# Patient Record
Sex: Female | Born: 1970 | Race: Black or African American | Hispanic: No | Smoking: Current some day smoker
Health system: Southern US, Community
[De-identification: ages and names within clinical notes are randomized; demographics above are authoritative.]

## PROBLEM LIST (undated history)

## (undated) DIAGNOSIS — D649 Anemia, unspecified: Secondary | ICD-10-CM

## (undated) DIAGNOSIS — R011 Cardiac murmur, unspecified: Secondary | ICD-10-CM

## (undated) HISTORY — DX: Cardiac murmur, unspecified: R01.1

## (undated) HISTORY — PX: CHOLECYSTECTOMY: SHX55

## (undated) HISTORY — DX: Anemia, unspecified: D64.9

---

## 2000-06-22 ENCOUNTER — Other Ambulatory Visit: Admission: RE | Admit: 2000-06-22 | Discharge: 2000-06-22 | Payer: Self-pay | Admitting: Family Medicine

## 2001-12-24 ENCOUNTER — Emergency Department (HOSPITAL_COMMUNITY): Admission: EM | Admit: 2001-12-24 | Discharge: 2001-12-24 | Payer: Self-pay | Admitting: Emergency Medicine

## 2002-12-04 ENCOUNTER — Inpatient Hospital Stay (HOSPITAL_COMMUNITY): Admission: AD | Admit: 2002-12-04 | Discharge: 2002-12-04 | Payer: Self-pay | Admitting: Family Medicine

## 2002-12-04 ENCOUNTER — Encounter: Payer: Self-pay | Admitting: Family Medicine

## 2002-12-25 ENCOUNTER — Inpatient Hospital Stay (HOSPITAL_COMMUNITY): Admission: AD | Admit: 2002-12-25 | Discharge: 2002-12-25 | Payer: Self-pay | Admitting: Obstetrics & Gynecology

## 2003-04-02 ENCOUNTER — Inpatient Hospital Stay (HOSPITAL_COMMUNITY): Admission: AD | Admit: 2003-04-02 | Discharge: 2003-04-02 | Payer: Self-pay | Admitting: Obstetrics

## 2003-04-03 ENCOUNTER — Encounter (INDEPENDENT_AMBULATORY_CARE_PROVIDER_SITE_OTHER): Payer: Self-pay

## 2003-04-03 ENCOUNTER — Inpatient Hospital Stay (HOSPITAL_COMMUNITY): Admission: AD | Admit: 2003-04-03 | Discharge: 2003-04-06 | Payer: Self-pay | Admitting: Obstetrics

## 2003-04-08 ENCOUNTER — Encounter: Admission: RE | Admit: 2003-04-08 | Discharge: 2003-05-08 | Payer: Self-pay | Admitting: Obstetrics

## 2006-04-03 ENCOUNTER — Emergency Department (HOSPITAL_COMMUNITY): Admission: EM | Admit: 2006-04-03 | Discharge: 2006-04-03 | Payer: Self-pay | Admitting: Emergency Medicine

## 2007-02-24 ENCOUNTER — Ambulatory Visit (HOSPITAL_COMMUNITY): Admission: RE | Admit: 2007-02-24 | Discharge: 2007-02-24 | Payer: Self-pay | Admitting: Obstetrics & Gynecology

## 2007-05-15 ENCOUNTER — Inpatient Hospital Stay (HOSPITAL_COMMUNITY): Admission: AD | Admit: 2007-05-15 | Discharge: 2007-05-15 | Payer: Self-pay | Admitting: Obstetrics

## 2007-05-16 ENCOUNTER — Inpatient Hospital Stay (HOSPITAL_COMMUNITY): Admission: AD | Admit: 2007-05-16 | Discharge: 2007-05-16 | Payer: Self-pay | Admitting: Obstetrics

## 2007-05-16 ENCOUNTER — Inpatient Hospital Stay (HOSPITAL_COMMUNITY): Admission: AD | Admit: 2007-05-16 | Discharge: 2007-05-19 | Payer: Self-pay | Admitting: Obstetrics

## 2009-12-10 ENCOUNTER — Ambulatory Visit: Payer: Self-pay | Admitting: Internal Medicine

## 2009-12-21 ENCOUNTER — Emergency Department (HOSPITAL_COMMUNITY): Admission: EM | Admit: 2009-12-21 | Discharge: 2009-12-21 | Payer: Self-pay | Admitting: Emergency Medicine

## 2010-03-21 ENCOUNTER — Ambulatory Visit: Payer: Self-pay | Admitting: Internal Medicine

## 2010-06-12 ENCOUNTER — Ambulatory Visit: Payer: Self-pay | Admitting: Internal Medicine

## 2010-06-20 ENCOUNTER — Ambulatory Visit (HOSPITAL_COMMUNITY): Admission: RE | Admit: 2010-06-20 | Discharge: 2010-06-20 | Payer: Self-pay | Admitting: Internal Medicine

## 2011-03-19 ENCOUNTER — Ambulatory Visit: Payer: Medicaid Other | Attending: Family Medicine | Admitting: Rehabilitative and Restorative Service Providers"

## 2011-03-19 DIAGNOSIS — M25519 Pain in unspecified shoulder: Secondary | ICD-10-CM | POA: Insufficient documentation

## 2011-03-19 DIAGNOSIS — IMO0001 Reserved for inherently not codable concepts without codable children: Secondary | ICD-10-CM | POA: Insufficient documentation

## 2011-03-19 DIAGNOSIS — M542 Cervicalgia: Secondary | ICD-10-CM | POA: Insufficient documentation

## 2011-03-24 LAB — URINALYSIS, ROUTINE W REFLEX MICROSCOPIC
Bilirubin Urine: NEGATIVE
Glucose, UA: NEGATIVE mg/dL
Ketones, ur: NEGATIVE mg/dL
Protein, ur: NEGATIVE mg/dL
pH: 6.5 (ref 5.0–8.0)

## 2011-03-24 LAB — URINE MICROSCOPIC-ADD ON

## 2011-03-24 LAB — PREGNANCY, URINE: Preg Test, Ur: NEGATIVE

## 2011-03-25 ENCOUNTER — Ambulatory Visit: Payer: Medicaid Other | Attending: Family Medicine | Admitting: Physical Therapy

## 2011-03-25 DIAGNOSIS — IMO0001 Reserved for inherently not codable concepts without codable children: Secondary | ICD-10-CM | POA: Insufficient documentation

## 2011-03-25 DIAGNOSIS — M25519 Pain in unspecified shoulder: Secondary | ICD-10-CM | POA: Insufficient documentation

## 2011-03-25 DIAGNOSIS — M542 Cervicalgia: Secondary | ICD-10-CM | POA: Insufficient documentation

## 2011-04-02 ENCOUNTER — Ambulatory Visit: Payer: Medicaid Other | Admitting: Rehabilitative and Restorative Service Providers"

## 2011-04-03 ENCOUNTER — Encounter: Payer: Medicaid Other | Admitting: Physical Therapy

## 2011-04-08 ENCOUNTER — Ambulatory Visit: Payer: Medicaid Other | Admitting: Physical Therapy

## 2011-05-09 NOTE — Op Note (Signed)
[name], [name]                         ACCOUNT NO.:  [id number]   MEDICAL RECORD NO.:  [id number]                   PATIENT TYPE:  INP   LOCATION:  9110                                 FACILITY:  [facility]   PHYSICIAN:  Roseanna Rainbow, M.D.         DATE OF BIRTH:  [birth year]   DATE OF PROCEDURE:  04/03/2003  DATE OF DISCHARGE:                                 OPERATIVE REPORT   PREOPERATIVE DIAGNOSES:  1. Prolonged fetal heart rate deceleration.  2. Meconium stained fluid.  3. Intrauterine pregnancy at term.  4. Active labor.   POSTOPERATIVE DIAGNOSES:  1. Prolonged fetal heart rate deceleration.  2. Meconium stained fluid.  3. Intrauterine pregnancy at term.  4. Active labor.   PROCEDURE:  Primary low transverse cesarean section via Pfannenstiel  incision.   SURGEON:  Roseanna Rainbow, M.D.   ANESTHESIA:  Spinal.   COMPLICATIONS:  None.   ESTIMATED BLOOD LOSS:  800 mL.   FLUIDS:  As per anesthesiology.   URINE OUTPUT:  As per anesthesiology.   INDICATIONS:  The patient is a 40 year old at 40+ weeks.  She underwent a  prolonged deceleration with oxytocin.  Meconium stained fluid noted.   FINDINGS:  Female in cephalic presentation, occipitoposterior.  Thick  meconium below the cords. Neonatology present at delivery.  Apgars 7 and 8.  Umbilical artery pH 7.21.  Normal uterus, tubes and ovaries.   DESCRIPTION OF PROCEDURE:  The patient was taken to the operating room where  a spinal anesthetic was administered and found to be adequate.  She was then  prepped and draped in the usual sterile fashion in the dorsal supine  position with a leftward tilt.  A Pfannenstiel skin incision was then made  with the scalpel and carried to and through the underlying layer of the  fascia with the Bovie.  The fascia was nicked in the midline, and the  incision extended laterally with Mayo scissors.  The superior aspect of the  fascial incision was then grasped with  Kocher clamps, elevated and the  underlying rectus muscles dissected off.  Attention was then turned to the  inferior aspect of this incision which was manipulated in a similar fashion.  The rectus muscles were then separated in the midline and the parietal  peritoneum identified, tented up and entered sharply with Metzenbaum  scissors.  The peritoneal incision was then superiorly and inferiorly with  good visualization of the bladder.  The bladder blade was then inserted and  the vesicouterine peritoneum identified, grasped with pick ups and entered  sharply with the Metzenbaum scissors.  This incision was then extended  laterally and the bladder flap created digitally.  The bladder blade was  then reinserted.  The lower uterine segment was incised in a transverse  fashion with the scalpel.  The uterine incision was then extended bluntly.  The bladder blade was removed and the head was delivered atraumatically.  The  nose and mouth were suctioned with a DeLee suction, and the cord clamped  and cut.  The infant was handed off to the awaiting neonatologist.  Cord  gases were sent.  The placenta was then removed.  The uterus was cleared of  all clots and debris.  The uterine incision was repaired with 0 Monocryl in  a running locked fashion.  A second layer of the same suture was used to  obtain excellent hemostasis.  The gutters were cleared of all clots.  The  peritoneum was closed with 2-0 Vicryl.  The fascia was reapproximated with 0  PDS in a running fashion.  The skin was closed with staples.  The patient  tolerated the procedure well.  Sponge, lap and needle counts were correct  x2.  Cefazolin 1 g was given at cord clamp.  The patient was taken to the  PACU in stable condition.                                               Roseanna Rainbow, M.D.    [name]  D:  04/03/2003  T:  04/03/2003  Job:  [id number]

## 2011-05-09 NOTE — Discharge Summary (Signed)
[name], [name]                         ACCOUNT NO.:  [id number]   MEDICAL RECORD NO.:  [id number]                   PATIENT TYPE:  INP   LOCATION:  9110                                 FACILITY:  [facility]   PHYSICIAN:  Roseanna Rainbow, M.D.         DATE OF BIRTH:  01/01/1971   DATE OF ADMISSION:  04/03/2003  DATE OF DISCHARGE:  04/06/2003                                 DISCHARGE SUMMARY   CHIEF COMPLAINT:  The patient is a 40 year old, para 0, African-American  female with an intrauterine pregnancy, estimated date of confinement March 31, 2003, who presents with regular uterine contractions.   HISTORY OF PRESENT ILLNESS:  Prenatal care significant for late entry and a  positive GBS culture.  She had a Bartholin's abscess that was I&D'd where a  catheter was placed.  She had asymptomatic bacteruria and anemia.   PAST MEDICAL HISTORY:  Recurrent urinary tract infections.   PAST SURGICAL HISTORY:  See above.   MEDICATIONS:  1. Prenatal vitamins.  2. Septra.  3. Darvocet.   ALLERGIES:  No known drug allergies.   SOCIAL HISTORY:  Single.  Positive tobacco use.  Positive for alcohol use  prior to the pregnancy.   PHYSICAL EXAMINATION:  VITAL SIGNS:  Stable.  Afebrile.  ABDOMEN:  Gravid.  Nontender.  PELVIC:  Cervix 3 to 4 cm, 90% effaced with the vertex at a -1 station.  Artificial rupture of membranes with meconium-stained fluid.  An  intrauterine pressure catheter and fetal scalp electrode were placed.   ASSESSMENT:  1. Intrauterine pregnancy at 40+ weeks.  2. Meconium-stained fluid.  3. Early labor.  4. Group B Streptococcus positive.   PLAN:  1. Admission.  2. Amnioinfusion.  3. Antibiotic prophylaxis for positive GBS.  4. Monitor progress closely.   HOSPITAL COURSE:  Subsequent to the admission, there was a prolonged fetal  heart rate deceleration, and she was brought to the operating room for an  urgent cesarean delivery; please see the dictated  operative summary for  further details.  The postoperative course was uneventful.  Her hemoglobin  on postoperative day number one was 8.4.  She was discharged to home on  postoperative day number three, tolerating a regular diet.  She received  Depo-Provera intramuscularly for contraception.   DISCHARGE DIAGNOSES:  1. Intrauterine pregnancy at term.  2. Early labor.  3. Meconium-stained fluid.  4. Suspicious fetal heart tracing with a prolonged fetal heart rate     deceleration.   PROCEDURE:  Cesarean delivery.   CONDITION:  Stable.   DIET:  Regular.   ACTIVITY:  As per the instruction booklet.   DISPOSITION:  The patient is to follow up in the office in one week for an  incision check.   DISCHARGE MEDICATIONS:  1. Percocet.  2. Ferrous sulfate.  3. Ibuprofen.  4. Prenatal vitamins.  5. Colace as needed.  Roseanna Rainbow, M.D.    [name]  D:  05/09/2003  T:  05/09/2003  Job:  [id number]

## 2011-08-22 ENCOUNTER — Emergency Department (HOSPITAL_COMMUNITY)
Admission: EM | Admit: 2011-08-22 | Discharge: 2011-08-22 | Disposition: A | Discharge status: Home / Self Care | Payer: Medicaid Other | Attending: Emergency Medicine | Admitting: Emergency Medicine

## 2011-08-22 DIAGNOSIS — N390 Urinary tract infection, site not specified: Secondary | ICD-10-CM | POA: Insufficient documentation

## 2011-08-22 DIAGNOSIS — R109 Unspecified abdominal pain: Secondary | ICD-10-CM | POA: Insufficient documentation

## 2011-08-22 DIAGNOSIS — R300 Dysuria: Secondary | ICD-10-CM | POA: Insufficient documentation

## 2011-08-22 DIAGNOSIS — R350 Frequency of micturition: Secondary | ICD-10-CM | POA: Insufficient documentation

## 2011-08-22 LAB — URINE MICROSCOPIC-ADD ON

## 2011-08-22 LAB — URINALYSIS, ROUTINE W REFLEX MICROSCOPIC
Nitrite: POSITIVE — AB
Protein, ur: NEGATIVE mg/dL
Specific Gravity, Urine: 1.02 (ref 1.005–1.030)
Urobilinogen, UA: 0.2 mg/dL (ref 0.0–1.0)

## 2012-01-30 ENCOUNTER — Other Ambulatory Visit: Payer: Self-pay | Admitting: Obstetrics

## 2012-01-30 DIAGNOSIS — Z1231 Encounter for screening mammogram for malignant neoplasm of breast: Secondary | ICD-10-CM

## 2012-02-20 ENCOUNTER — Ambulatory Visit (HOSPITAL_COMMUNITY)
Admission: RE | Admit: 2012-02-20 | Discharge: 2012-02-20 | Disposition: A | Discharge status: Home / Self Care | Payer: Medicaid Other | Source: Ambulatory Visit | Attending: Obstetrics | Admitting: Obstetrics

## 2012-02-20 DIAGNOSIS — Z1231 Encounter for screening mammogram for malignant neoplasm of breast: Secondary | ICD-10-CM | POA: Insufficient documentation

## 2012-05-02 ENCOUNTER — Encounter (HOSPITAL_COMMUNITY): Payer: Self-pay | Admitting: *Deleted

## 2012-05-02 ENCOUNTER — Emergency Department (HOSPITAL_COMMUNITY)
Admission: EM | Admit: 2012-05-02 | Discharge: 2012-05-02 | Disposition: A | Discharge status: Home / Self Care | Payer: Medicaid Other | Attending: Emergency Medicine | Admitting: Emergency Medicine

## 2012-05-02 ENCOUNTER — Emergency Department (HOSPITAL_COMMUNITY): Payer: Medicaid Other

## 2012-05-02 DIAGNOSIS — R10819 Abdominal tenderness, unspecified site: Secondary | ICD-10-CM | POA: Insufficient documentation

## 2012-05-02 DIAGNOSIS — K8020 Calculus of gallbladder without cholecystitis without obstruction: Secondary | ICD-10-CM | POA: Insufficient documentation

## 2012-05-02 DIAGNOSIS — R1011 Right upper quadrant pain: Secondary | ICD-10-CM | POA: Insufficient documentation

## 2012-05-02 DIAGNOSIS — K8050 Calculus of bile duct without cholangitis or cholecystitis without obstruction: Secondary | ICD-10-CM

## 2012-05-02 LAB — COMPREHENSIVE METABOLIC PANEL
Alkaline Phosphatase: 69 U/L (ref 39–117)
BUN: 9 mg/dL (ref 6–23)
Chloride: 104 mEq/L (ref 96–112)
Creatinine, Ser: 0.72 mg/dL (ref 0.50–1.10)
GFR calc Af Amer: >90 mL/min (ref >90)
Glucose, Bld: 93 mg/dL (ref 70–99)
Potassium: 3.8 mEq/L (ref 3.5–5.1)
Total Bilirubin: 0.3 mg/dL (ref 0.3–1.2)
Total Protein: 7.6 g/dL (ref 6.0–8.3)

## 2012-05-02 LAB — URINALYSIS, ROUTINE W REFLEX MICROSCOPIC
Bilirubin Urine: NEGATIVE
Glucose, UA: NEGATIVE mg/dL
Hgb urine dipstick: NEGATIVE
Ketones, ur: NEGATIVE mg/dL
Protein, ur: NEGATIVE mg/dL
pH: 7 (ref 5.0–8.0)

## 2012-05-02 LAB — DIFFERENTIAL
Basophils Relative: 1 % (ref 0–1)
Eosinophils Relative: 0 % (ref 0–5)
Monocytes Absolute: 0.5 10*3/uL (ref 0.1–1.0)
Neutro Abs: 3.4 10*3/uL (ref 1.7–7.7)
Neutrophils Relative %: 66 % (ref 43–77)

## 2012-05-02 LAB — LIPASE, BLOOD: Lipase: 12 U/L (ref 11–59)

## 2012-05-02 LAB — CBC
HCT: 34.6 % — ABNORMAL LOW (ref 36.0–46.0)
Hemoglobin: 11.3 g/dL — ABNORMAL LOW (ref 12.0–15.0)
MCH: 23.4 pg — ABNORMAL LOW (ref 26.0–34.0)
RBC: 4.83 MIL/uL (ref 3.87–5.11)

## 2012-05-02 MED ORDER — ONDANSETRON 4 MG PO TBDP: 4.0000 mg | ORAL_TABLET | Freq: Three times a day (TID) | ORAL | Status: AC | PRN

## 2012-05-02 MED ORDER — HYDROCODONE-ACETAMINOPHEN 5-325 MG PO TABS: 2.0000 | ORAL_TABLET | ORAL | Status: AC | PRN

## 2012-05-02 NOTE — ED Notes (Signed)
Pt discharged home. Had no further questions at the time. 

## 2012-05-02 NOTE — Discharge Instructions (Signed)
Labs today were normal. Your ultrasound showed the you have gallstones, but no signs of gallbladder infection. The gallstones likely caused your pain. It is important that you followup with a surgeon to discuss the need for cholecystectomy to remove the gallbladder. Please plan to followup with [facility]. If you begin to run a fever, have worsening pain, nausea/vomiting, or any other concerns about worsening condition, please return to the emergency department.  Biliary Colic  Biliary colic is a steady or irregular pain in the upper abdomen. It is usually under the right side of the rib cage. It happens when gallstones interfere with the normal flow of bile from the gallbladder. Bile is a liquid that helps to digest fats. Bile is made in the liver and stored in the gallbladder. When you eat a meal, bile passes from the gallbladder through the cystic duct and the common bile duct into the small intestine. There, it mixes with partially digested food. If a gallstone blocks either of these ducts, the normal flow of bile is blocked. The muscle cells in the bile duct contract forcefully to try to move the stone. This causes the pain of biliary colic.  SYMPTOMS   A person with biliary colic usually complains of pain in the upper abdomen. This pain can be:   In the center of the upper abdomen just below the breastbone.   In the upper-right part of the abdomen, near the gallbladder and liver.   Spread back toward the right shoulder blade.   Nausea and vomiting.   The pain usually occurs after eating.   Biliary colic is usually triggered by the digestive system's demand for bile. The demand for bile is high after fatty meals. Symptoms can also occur when a person who has been fasting suddenly eats a very large meal. Most episodes of biliary colic pass after 1 to 5 hours. After the most intense pain passes, your abdomen may continue to ache mildly for about 24 hours.  DIAGNOSIS  After you  describe your symptoms, your caregiver will perform a physical exam. He or she will pay attention to the upper right portion of your belly (abdomen). This is the area of your liver and gallbladder. An ultrasound will help your caregiver look for gallstones. Specialized scans of the gallbladder may also be done. Blood tests may be done, especially if you have fever or if your pain persists. PREVENTION  Biliary colic can be prevented by controlling the risk factors for gallstones. Some of these risk factors, such as heredity, increasing age, and pregnancy are a normal part of life. Obesity and a high-fat diet are risk factors you can change through a healthy lifestyle. Women going through menopause who take hormone replacement therapy (estrogen) are also more likely to develop biliary colic. TREATMENT   Pain medication may be prescribed.   You may be encouraged to eat a fat-free diet.   If the first episode of biliary colic is severe, or episodes of colic keep retuning, surgery to remove the gallbladder (cholecystectomy) is usually recommended. This procedure can be done through small incisions using an instrument called a laparoscope. The procedure often requires a brief stay in the hospital. Some people can leave the hospital the same day. It is the most widely used treatment in people troubled by painful gallstones. It is effective and safe, with no complications in more than 90% of cases.   If surgery cannot be done, medication that dissolves gallstones may be used. This medication is  expensive and can take months or years to work. Only small stones will dissolve.   Rarely, medication to dissolve gallstones is combined with a procedure called shock-wave lithotripsy. This procedure uses carefully aimed shock waves to break up gallstones. In many people treated with this procedure, gallstones form again within a few years.  PROGNOSIS  If gallstones block your cystic duct or common bile duct, you are  at risk for repeated episodes of biliary colic. There is also a 25% chance that you will develop a gallbladder infection(acute cholecystitis), or some other complication of gallstones within 10 to 20 years. If you have surgery, schedule it at a time that is convenient for you and at a time when you are not sick. HOME CARE INSTRUCTIONS   Drink plenty of clear fluids.   Avoid fatty, greasy or fried foods, or any foods that make your pain worse.   Take medications as directed.  SEEK MEDICAL CARE IF:   You develop a fever over 100.5 F (38.1 C).   Your pain gets worse over time.   You develop nausea that prevents you from eating and drinking.   You develop vomiting.  SEEK IMMEDIATE MEDICAL CARE IF:   You have continuous or severe belly (abdominal) pain which is not relieved with medications.   You develop nausea and vomiting which is not relieved with medications.   You have symptoms of biliary colic and you suddenly develop a fever and shaking chills. This may signal cholecystitis. Call your caregiver immediately.   You develop a yellow color to your skin or the white part of your eyes (jaundice).  Document Released: 05/11/2006 Document Revised: 11/27/2011 Document Reviewed: 07/20/2008 [facility] Patient Information 2012 [place], [place].

## 2012-05-02 NOTE — ED Provider Notes (Signed)
History     CSN: [id number]  Arrival date & time 05/02/12  0809   First MD Initiated Contact with Patient 05/02/12 [phone]      Chief Complaint  Patient presents with  . Abdominal Pain  . Back Pain    (Consider location/radiation/quality/duration/timing/severity/associated sxs/prior treatment) HPI History from patient. 41 year old female who presents with abdominal pain. States this began last night around 1 AM, and she was unable to sleep due to the discomfort. Pain is described as sharp and located to the right upper quadrant, with occasional radiation to the back. It worsens with lying flat, and does not change with movement. She denies any nausea, vomiting, diarrhea. She's not had any vaginal bleeding, discharge, or urinary symptoms. Has never had anything like this before. Surgical history significant for cesarean section, but otherwise no abdominal surgeries. No treatment at home prior to arrival.  She's currently using Depo-Provera for birth control. States that she does drink alcohol, but seldom does so.  History reviewed. No pertinent past medical history.  History reviewed. No pertinent past surgical history.  History reviewed. No pertinent family history.  History  Substance Use Topics  . Smoking status: Not on file  . Smokeless tobacco: Not on file  . Alcohol Use: Yes     occ    OB History    Grav Para Term Preterm Abortions TAB SAB Ect Mult Living                  Review of Systems  Constitutional: Negative for fever and chills.  HENT: Negative.   Respiratory: Negative for cough, chest tightness and shortness of breath.   Cardiovascular: Negative for chest pain.  Gastrointestinal: Positive for abdominal pain. Negative for nausea, vomiting and diarrhea.  Genitourinary: Negative for dysuria, flank pain, vaginal bleeding and vaginal discharge.  Musculoskeletal: Negative for myalgias.  Skin: Negative for color change and rash.    Allergies  Review of  patient's allergies indicates no known allergies.  Home Medications  No current outpatient prescriptions on file.  BP 155/101  Pulse 85  Temp(Src) 98.3 F (36.8 C) (Oral)  Resp 20  SpO2 100%  LMP 04/18/2012  Physical Exam  Nursing note and vitals reviewed. Constitutional: She appears well-developed and well-nourished. No distress.  HENT:  Head: Normocephalic and atraumatic.  Eyes: EOM are normal.  Neck: Normal range of motion.  Cardiovascular: Normal rate, regular rhythm and normal heart sounds.   Pulmonary/Chest: Effort normal and breath sounds normal. She exhibits no tenderness.  Abdominal: Soft. Bowel sounds are normal. There is tenderness.       Tenderness to palpation in right upper quadrant, with very slight tenderness in the epigastrium as well. Negative Murphy sign. No rebound or guarding.  Musculoskeletal: Normal range of motion.  Neurological: She is alert.  Skin: Skin is warm and dry. She is not diaphoretic.  Psychiatric: She has a normal mood and affect.    ED Course  Procedures (including critical care time)  Labs Reviewed  URINALYSIS, ROUTINE W REFLEX MICROSCOPIC - Abnormal; Notable for the following:    APPearance CLOUDY (*)    Leukocytes, UA TRACE (*)    All other components within normal limits  CBC - Abnormal; Notable for the following:    Hemoglobin 11.3 (*)    HCT 34.6 (*)    MCV 71.6 (*)    MCH 23.4 (*)    All other components within normal limits  URINE MICROSCOPIC-ADD ON - Abnormal; Notable for the following:  Squamous Epithelial / LPF FEW (*)    All other components within normal limits  POCT PREGNANCY, URINE  DIFFERENTIAL  COMPREHENSIVE METABOLIC PANEL  LIPASE, BLOOD   US Abdomen Complete  05/02/2012  *RADIOLOGY REPORT*  Clinical Data:  Right upper quadrant pain  COMPLETE ABDOMINAL ULTRASOUND  Comparison:  None.  Findings:  Gallbladder:  Mobile gallstones are noted within gallbladder the largest measures 7.7 mm.  There is mild thickening  of gallbladder wall up to 4 mm without sonographic Murphy's sign.  Common bile duct:  Measures 3.7 mm in diameter within normal limits.  Liver:  No focal lesion identified.  Within normal limits in parenchymal echogenicity.  IVC:  Appears normal.  Pancreas:  No focal abnormality seen.  Spleen:  Measures 3.8 cm in length.  Normal echogenicity.  Right Kidney:  Measures 11.7 cm in length.  No hydronephrosis, mass or diagnostic renal calculus.  Left Kidney:  Measures 12.3 cm in length.  No mass, hydronephrosis or diagnostic renal calculus the  Abdominal aorta:  No aneurysm identified. Measures up to 1.7 cm in diameter.  IMPRESSION:  1.  Mobile gallstones are noted within gallbladder the largest measures 7.7 mm. 2.  Mild thickening of gallbladder wall without sonographic Murphy's sign. 3.  Normal CBD. 4.  No hydronephrosis or diagnostic renal calculus.  Original Report Authenticated By: Natasha Mead, M.D.     1. Biliary colic   2. Gallstones       MDM  Patient presents with right upper quadrant/epigastric abdominal pain, which started last evening. She is afebrile and nontoxic appearing. On exam, she is noted to have tenderness mostly to the right upper quadrant, with a negative Murphy sign. Lab evaluation largely unremarkable. Ultrasound shows mobile gallstones with mild gallbladder wall thickening without Murphy sign. Discussed findings with Dr. Freida Busman. We will have the patient followup with surgery to discuss elective cholecystectomy. Findings discussed with patient. Return precautions discussed. She verbalized understanding and was agreeable with plan.        Grant Fontana, [place] 05/02/12 1145

## 2012-05-02 NOTE — ED Notes (Signed)
Pt reports unable to sleep last night due to lower back pain and right side abd pain. Denies n/v/d, vaginal or urinary symptoms. No acute distress noted at triage.

## 2012-05-02 NOTE — ED Notes (Signed)
Awaiting [place].  Pt resting quietly. Watching TV

## 2012-05-02 NOTE — ED Notes (Signed)
Pt in [place]

## 2012-05-03 MED ORDER — ACETAMINOPHEN 80 MG/0.8ML PO SUSP
ORAL | Status: AC
  Filled 2012-05-03: qty 15

## 2012-05-04 NOTE — ED Provider Notes (Signed)
Medical screening examination/treatment/procedure(s) were performed by non-physician practitioner and as supervising physician I was immediately available for consultation/collaboration.  Victorino Fatzinger T Teirra Carapia, MD 05/04/12 1511 

## 2012-05-26 ENCOUNTER — Encounter (INDEPENDENT_AMBULATORY_CARE_PROVIDER_SITE_OTHER): Payer: Self-pay | Admitting: Surgery

## 2012-05-26 ENCOUNTER — Ambulatory Visit (INDEPENDENT_AMBULATORY_CARE_PROVIDER_SITE_OTHER): Payer: Medicaid Other | Admitting: Surgery

## 2012-05-26 VITALS — BP 118/80 | HR 76 | Temp 97.9°F | Resp 14 | Ht 65.0 in | Wt 186.4 lb

## 2012-05-26 DIAGNOSIS — K8020 Calculus of gallbladder without cholecystitis without obstruction: Secondary | ICD-10-CM

## 2012-05-26 NOTE — Patient Instructions (Signed)
Laparoscopic Cholecystectomy Laparoscopic cholecystectomy is surgery to remove the gallbladder. The gallbladder is located slightly to the right of center in the abdomen, behind the liver. It is a concentrating and storage sac for the bile produced in the liver. Bile aids in the digestion and absorption of fats. Gallbladder disease (cholecystitis) is an inflammation of your gallbladder. This condition is usually caused by a buildup of gallstones (cholelithiasis) in your gallbladder. Gallstones can block the flow of bile, resulting in inflammation and pain. In severe cases, emergency surgery may be required. When emergency surgery is not required, you will have time to prepare for the procedure. Laparoscopic surgery is an alternative to open surgery. Laparoscopic surgery usually has a shorter recovery time. Your common bile duct may also need to be examined and explored. Your caregiver will discuss this with you if he or she feels this should be done. If stones are found in the common bile duct, they may be removed. LET YOUR CAREGIVER KNOW ABOUT:  Allergies to food or medicine.   Medicines taken, including vitamins, herbs, eyedrops, over-the-counter medicines, and creams.   Use of steroids (by mouth or creams).   Previous problems with anesthetics or numbing medicines.   History of bleeding problems or blood clots.   Previous surgery.   Other health problems, including diabetes and kidney problems.   Possibility of pregnancy, if this applies.  RISKS AND COMPLICATIONS All surgery is associated with risks. Some problems that may occur following this procedure include:  Infection.   Damage to the common bile duct, nerves, arteries, veins, or other internal organs such as the stomach or intestines.   Bleeding.   A stone may remain in the common bile duct.  BEFORE THE PROCEDURE  Do not take aspirin for 3 days prior to surgery or blood thinners for 1 week prior to surgery.   Do not eat or  drink anything after midnight the night before surgery.   Let your caregiver know if you develop a cold or other infectious problem prior to surgery.   You should be present 60 minutes before the procedure or as directed.  PROCEDURE  You will be given medicine that makes you sleep (general anesthetic). When you are asleep, your surgeon will make several small cuts (incisions) in your abdomen. One of these incisions is used to insert a small, lighted scope (laparoscope) into the abdomen. The laparoscope helps the surgeon see into your abdomen. Carbon dioxide gas will be pumped into your abdomen. The gas allows more room for the surgeon to perform your surgery. Other operating instruments are inserted through the other incisions. Laparoscopic procedures may not be appropriate when:  There is major scarring from previous surgery.   The gallbladder is extremely inflamed.   There are bleeding disorders or unexpected cirrhosis of the liver.   A pregnancy is near term.   Other conditions make the laparoscopic procedure impossible.  If your surgeon feels it is not safe to continue with a laparoscopic procedure, he or she will perform an open abdominal procedure. In this case, the surgeon will make an incision to open the abdomen. This gives the surgeon a larger view and field to work within. This may allow the surgeon to perform procedures that sometimes cannot be performed with a laparoscope alone. Open surgery has a longer recovery time. AFTER THE PROCEDURE  You will be taken to the recovery area where a nurse will watch and check your progress.   You may be allowed to go home   the same day.   Do not resume physical activities until directed by your caregiver.   You may resume a normal diet and activities as directed.  Document Released: 12/08/2005 Document Revised: 11/27/2011 Document Reviewed: 05/23/2011 [employer] Patient Information 2012 [employer].     Cholelithiasis Cholelithiasis (also called gallstones) is a form of gallbladder disease where gallstones form in your gallbladder. The gallbladder is a non-essential organ that stores bile made in the liver, which helps digest fats. Gallstones begin as small crystals and slowly grow into stones. Gallstone pain occurs when the gallbladder spasms, and a gallstone is blocking the duct. Pain can also occur when a stone passes out of the duct.  Women are more likely to develop gallstones than men. Other factors that increase the risk of gallbladder disease are:  Having multiple pregnancies. Physicians sometimes advise removing diseased gallbladders before future pregnancies.   Obesity.   Diets heavy in fried foods and fat.   Increasing age (older than 60).   Prolonged use of medications containing female hormones.   Diabetes mellitus.   Rapid weight loss.   Family history of gallstones (heredity).  SYMPTOMS  Feeling sick to your stomach (nauseous).   Abdominal pain.   Yellowing of the skin (jaundice).   Sudden pain. It may persist from several minutes to several hours.   Worsening pain with deep breathing or when jarred.   Fever.   Tenderness to the touch.  In some cases, when gallstones do not move into the bile duct, people have no pain or symptoms. These are called "silent" gallstones. TREATMENT In severe cases, emergency surgery may be required. HOME CARE INSTRUCTIONS   Only take over-the-counter or prescription medicines for pain, discomfort, or fever as directed by your caregiver.   Follow a low-fat diet until seen again. Fat causes the gallbladder to contract, which can result in pain.   Follow up as instructed. Attacks are almost always recurrent and surgery is usually required for permanent treatment.  SEEK IMMEDIATE MEDICAL CARE IF:   Your pain increases and is not controlled by medications.   You have an oral temperature above 102 F (38.9 C), not  controlled by medication.   You develop nausea and vomiting.  MAKE SURE YOU:   Understand these instructions.   Will watch your condition.   Will get help right away if you are not doing well or get worse.  Document Released: 12/04/2005 Document Revised: 11/27/2011 Document Reviewed: 02/06/2011 [employer] Patient Information 2012 [employer].Laparoscopic Cholecystectomy Care After Refer to this sheet in the next few weeks. These instructions provide you with information on caring for yourself after your procedure. Your caregiver may also give you more specific instructions. Your treatment has been planned according to current medical practices, but problems sometimes occur. Call your caregiver if you have any problems or questions after your procedure. HOME CARE INSTRUCTIONS   Change bandages (dressings) as directed by your caregiver.   Keep the wound dry and clean. The wound may be washed gently with soap and water. Gently blot or dab the area dry.   Do not take baths or use swimming pools or hot tubs for 10 days, or as instructed by your caregiver.   Only take over-the-counter or prescription medicines for pain, discomfort, or fever as directed by your caregiver.   Continue your normal diet as directed by your caregiver.   Do not lift anything heavier than 25 pounds (11.5 kg), or as directed by your caregiver.   Do not   play contact sports for 1 week, or as directed by your caregiver.  SEEK MEDICAL CARE IF:   There is redness, swelling, or increasing pain in the wound.   You notice yellowish-white fluid (pus) coming from the wound.   There is drainage from the wound that lasts longer than 1 day.   There is a bad smell coming from the wound or dressing.   The surgical cut (incision) breaks open.  SEEK IMMEDIATE MEDICAL CARE IF:   You develop a rash.   You have difficulty breathing.   You develop chest pain.   You develop any reaction or side effects to medicines  given.   You have a fever.   You have increasing pain in the shoulders (shoulder strap areas).   You have dizzy episodes or faint while standing.   You develop severe abdominal pain.   You feel sick to your stomach (nauseous) or throw up (vomit) and this lasts for more than 1 day.  MAKE SURE YOU:   Understand these instructions.   Will watch your condition.   Will get help right away if you are not doing well or get worse.  Document Released: 12/08/2005 Document Revised: 11/27/2011 Document Reviewed: 05/23/2011 [employer] Patient Information 2012 [employer]. 

## 2012-05-26 NOTE — Progress Notes (Signed)
Patient ID: [name], female   DOB: [birth year], 41 y.o.   MRN: [id number]  No chief complaint on file.   HPI SEQUOYA Dominguez is a 41 y.o. female.   HPIPatient sent at the request of Dr. Freida Busman due  to right upper quadrant abdominal pain and dyspepsia. She had no tach of this pain one month ago prompting her to go to the emergency room. The pain was sharp in nature located in her right upper quadrant and epigastrium and constant. The pain improved with pain medicine and out shunt showed gallstones. She is watch her diet and limited fat intake. She has had no significant attacks since that period.  Past Medical History  Diagnosis Date  . Anemia   . Heart murmur     Past Surgical History  Procedure Date  . Cesarean section [id number]    Family History  Problem Relation Age of Onset  . Aneurysm Mother     Social History History  Substance Use Topics  . Smoking status: Former Smoker    Quit date: 05/26/2005  . Smokeless tobacco: Not on file  . Alcohol Use: Yes     occ    No Known Allergies  No current outpatient prescriptions on file.    Review of Systems Review of Systems  Constitutional: Negative for fever, chills and unexpected weight change.  HENT: Negative for hearing loss, congestion, sore throat, trouble swallowing and voice change.   Eyes: Negative for visual disturbance.  Respiratory: Negative for cough and wheezing.   Cardiovascular: Negative for chest pain, palpitations and leg swelling.  Gastrointestinal: Negative for nausea, vomiting, abdominal pain, diarrhea, constipation, blood in stool, abdominal distention and anal bleeding.  Genitourinary: Negative for hematuria, vaginal bleeding and difficulty urinating.  Musculoskeletal: Negative for arthralgias.  Skin: Negative for rash and wound.  Neurological: Negative for seizures, syncope and headaches.  Hematological: Negative for adenopathy. Does not bruise/bleed easily.  Psychiatric/Behavioral: Negative  for confusion.    Blood pressure 118/80, pulse 76, temperature 97.9 F (36.6 C), resp. rate 14, height 5\' 5"  (1.651 m), weight 186 lb 6.4 oz (84.55 kg), last menstrual period 04/18/2012.  Physical Exam Physical Exam  Constitutional: She is oriented to person, place, and time. She appears well-developed and well-nourished.  HENT:  Head: Normocephalic and atraumatic.  Eyes: EOM are normal. Pupils are equal, round, and reactive to light.  Neck: Normal range of motion. Neck supple.  Cardiovascular: Normal rate and regular rhythm.   Pulmonary/Chest: Effort normal and breath sounds normal.  Abdominal: Soft. Bowel sounds are normal. She exhibits no distension. There is no tenderness.  Musculoskeletal: Normal range of motion.  Neurological: She is alert and oriented to person, place, and time.  Skin: Skin is warm and dry.  Psychiatric: She has a normal mood and affect. Her behavior is normal. Judgment and thought content normal.    Data Reviewed  Ultrasound shows gallstones without common duct dilatation.  Liver function within normal limits 05/02/2012 Assessment    Symptomatic gallstones    Plan    Recommend laparoscopic cholecystectomy cholangiogram.The procedure has been discussed with the patient. Operative and non operative treatments have been discussed. Risks of surgery include bleeding, infection,  Common bile duct injury,  Injury to the stomach,liver, colon,small intestine, abdominal wall,  Diaphragm,  Major blood vessels,  And the need for an open procedure.  Other risks include worsening of medical problems, death,  DVT and pulmonary embolism, and cardiovascular events.   Medical options have also been discussed.  The patient has been informed of long term expectations of surgery and non surgical options,  The patient agrees to proceed.         Mackenzie Mcclune A. 05/26/2012, 10:12 AM

## 2012-07-19 ENCOUNTER — Other Ambulatory Visit (INDEPENDENT_AMBULATORY_CARE_PROVIDER_SITE_OTHER): Payer: Self-pay | Admitting: Surgery

## 2012-07-19 DIAGNOSIS — K8010 Calculus of gallbladder with chronic cholecystitis without obstruction: Secondary | ICD-10-CM

## 2012-08-11 ENCOUNTER — Ambulatory Visit (INDEPENDENT_AMBULATORY_CARE_PROVIDER_SITE_OTHER): Payer: Medicaid Other | Admitting: Surgery

## 2012-08-11 ENCOUNTER — Encounter (INDEPENDENT_AMBULATORY_CARE_PROVIDER_SITE_OTHER): Payer: Self-pay | Admitting: Surgery

## 2012-08-11 VITALS — BP 120/74 | HR 86 | Temp 97.6°F | Resp 14 | Ht 65.0 in | Wt 184.8 lb

## 2012-08-11 DIAGNOSIS — Z9889 Other specified postprocedural states: Secondary | ICD-10-CM

## 2012-08-11 NOTE — Progress Notes (Signed)
she is here for a postop visit following laparoscopic cholecystectomy.  Diet is being tolerated, bowels are moving.  No problems with incisions.  PE:  ABD:  Soft, incisions clean/dry/intact and solid.  Path  Stones and chronic inflammation   Assessment:  Doing well postop.  Plan:  Lowfat diet recommended.  Activities as tolerated.  Return visit prn.

## 2012-08-11 NOTE — Patient Instructions (Signed)
Return as needed

## 2013-03-21 ENCOUNTER — Other Ambulatory Visit: Payer: Self-pay | Admitting: Obstetrics & Gynecology

## 2013-03-22 ENCOUNTER — Ambulatory Visit (INDEPENDENT_AMBULATORY_CARE_PROVIDER_SITE_OTHER): Payer: Medicaid Other | Admitting: *Deleted

## 2013-03-22 ENCOUNTER — Encounter: Payer: Self-pay | Admitting: *Deleted

## 2013-03-22 VITALS — BP 97/61 | HR 82 | Temp 97.9°F | Ht 65.0 in | Wt 188.0 lb

## 2013-03-22 DIAGNOSIS — Z3049 Encounter for surveillance of other contraceptives: Secondary | ICD-10-CM

## 2013-03-22 MED ORDER — MEDROXYPROGESTERONE ACETATE 150 MG/ML IM SUSP
150.0000 mg | Freq: Once | INTRAMUSCULAR | Status: AC
  Administered 2013-03-22: 150 mg via INTRAMUSCULAR

## 2013-03-22 NOTE — Progress Notes (Signed)
Medroxyprogesterone Acetate 150mg  given IM in right GM. Pt tolerated well. Pt is to return in 12 weeks for next injection. Pt expressed understanding.

## 2013-03-22 NOTE — Patient Instructions (Addendum)
Return as directed and as needed.

## 2013-06-13 ENCOUNTER — Encounter: Payer: Self-pay | Admitting: *Deleted

## 2013-06-13 ENCOUNTER — Ambulatory Visit (INDEPENDENT_AMBULATORY_CARE_PROVIDER_SITE_OTHER): Payer: Medicaid Other | Admitting: *Deleted

## 2013-06-13 VITALS — BP 130/84 | HR 76 | Temp 98.4°F | Ht 65.0 in | Wt 191.0 lb

## 2013-06-13 DIAGNOSIS — Z3049 Encounter for surveillance of other contraceptives: Secondary | ICD-10-CM

## 2013-06-13 MED ORDER — MEDROXYPROGESTERONE ACETATE 150 MG/ML IM SUSP
150.0000 mg | Freq: Once | INTRAMUSCULAR | Status: AC
  Administered 2013-06-13: 150 mg via INTRAMUSCULAR

## 2013-06-13 NOTE — Progress Notes (Signed)
Patient presented to office for Depo injection. Medroxyprogesterone 150 mg Lot #[id number] exp 01/2016 injected right deltoid Im per patient request. Patient tolerated injection with no problems noted. Patient advised to return between 09/08-09/22 for next injection. Patient verbalized understanding and agrees as instructed.

## 2013-08-29 ENCOUNTER — Ambulatory Visit (INDEPENDENT_AMBULATORY_CARE_PROVIDER_SITE_OTHER): Payer: Medicaid Other | Admitting: *Deleted

## 2013-08-29 VITALS — BP 128/83 | HR 80 | Wt 188.0 lb

## 2013-08-29 DIAGNOSIS — IMO0001 Reserved for inherently not codable concepts without codable children: Secondary | ICD-10-CM

## 2013-08-29 DIAGNOSIS — Z309 Encounter for contraceptive management, unspecified: Secondary | ICD-10-CM

## 2013-08-29 MED ORDER — MEDROXYPROGESTERONE ACETATE 150 MG/ML IM SUSP
150.0000 mg | INTRAMUSCULAR | Status: AC
  Administered 2013-08-29 – 2014-08-02 (×4): 150 mg via INTRAMUSCULAR

## 2013-08-29 NOTE — Progress Notes (Signed)
Patient is in the office for her Depo Provera. Patient reports she is doing well and only has spotting before her next shot is due. Patient is doing allergy shots at this time.

## 2013-11-21 ENCOUNTER — Ambulatory Visit (INDEPENDENT_AMBULATORY_CARE_PROVIDER_SITE_OTHER): Payer: Medicaid Other | Admitting: *Deleted

## 2013-11-21 VITALS — BP 129/81 | HR 80 | Temp 98.1°F | Wt 188.0 lb

## 2013-11-21 DIAGNOSIS — Z3049 Encounter for surveillance of other contraceptives: Secondary | ICD-10-CM

## 2013-11-21 DIAGNOSIS — Z309 Encounter for contraceptive management, unspecified: Secondary | ICD-10-CM

## 2013-11-21 NOTE — Progress Notes (Signed)
Pt in office today for depo injection.Pt tolerated well.  Pt due for next injection on 02/12/14.

## 2014-02-13 ENCOUNTER — Ambulatory Visit: Payer: Medicaid Other

## 2014-02-13 ENCOUNTER — Ambulatory Visit (INDEPENDENT_AMBULATORY_CARE_PROVIDER_SITE_OTHER): Payer: Medicaid Other | Admitting: *Deleted

## 2014-02-13 VITALS — BP 138/89 | HR 80 | Temp 97.3°F | Ht 65.0 in | Wt 191.0 lb

## 2014-02-13 DIAGNOSIS — Z3049 Encounter for surveillance of other contraceptives: Secondary | ICD-10-CM

## 2014-02-13 DIAGNOSIS — Z3042 Encounter for surveillance of injectable contraceptive: Secondary | ICD-10-CM

## 2014-02-13 NOTE — Progress Notes (Signed)
Patient in office for a Depo Injection. Patient is on time for her injection.  Next injection due 05-07-14.

## 2014-05-08 ENCOUNTER — Ambulatory Visit: Payer: Medicaid Other

## 2014-05-11 ENCOUNTER — Ambulatory Visit (INDEPENDENT_AMBULATORY_CARE_PROVIDER_SITE_OTHER): Payer: Medicaid Other | Admitting: *Deleted

## 2014-05-11 VITALS — BP 132/76 | HR 93 | Temp 97.2°F | Ht 64.0 in | Wt 189.0 lb

## 2014-05-11 DIAGNOSIS — Z3049 Encounter for surveillance of other contraceptives: Secondary | ICD-10-CM

## 2014-05-11 DIAGNOSIS — Z309 Encounter for contraceptive management, unspecified: Secondary | ICD-10-CM

## 2014-05-11 NOTE — Progress Notes (Signed)
Patient in office today for a Depo Injection. Patient is on time for injection.   Patient due for next injection 08-02-14. BP 132/76  Pulse 93  Temp(Src) 97.2 F (36.2 C)  Ht 5\' 4"  (1.626 m)  Wt 189 lb (85.73 kg)  BMI 32.43 kg/m2  Administrations This Visit   medroxyPROGESTERone (DEPO-PROVERA) injection 150 mg   Administered Action Dose Route Administered By   05/11/2014 Given 150 mg Intramuscular Shelda PalAndrea Lynn Noell Lorensen, LPN

## 2014-07-26 ENCOUNTER — Ambulatory Visit (INDEPENDENT_AMBULATORY_CARE_PROVIDER_SITE_OTHER): Payer: Medicaid Other | Admitting: Obstetrics & Gynecology

## 2014-07-26 ENCOUNTER — Encounter: Payer: Self-pay | Admitting: Obstetrics & Gynecology

## 2014-07-26 VITALS — BP 140/116 | HR 78 | Temp 97.8°F | Ht 64.0 in | Wt 187.0 lb

## 2014-07-26 DIAGNOSIS — Z0000 Encounter for general adult medical examination without abnormal findings: Secondary | ICD-10-CM

## 2014-07-26 DIAGNOSIS — Z113 Encounter for screening for infections with a predominantly sexual mode of transmission: Secondary | ICD-10-CM

## 2014-07-26 NOTE — Patient Instructions (Signed)
Health Maintenance Adopting a healthy lifestyle and getting preventive care can go a long way to promote health and wellness. Talk with your health care provider about what schedule of regular examinations is right for you. This is a good chance for you to check in with your provider about disease prevention and staying healthy. In between checkups, there are plenty of things you can do on your own. Experts have done a lot of research about which lifestyle changes and preventive measures are most likely to keep you healthy. Ask your health care provider for more information. WEIGHT AND DIET  Eat a healthy diet  Be sure to include plenty of vegetables, fruits, low-fat dairy products, and lean protein.  Do not eat a lot of foods high in solid fats, added sugars, or salt.  Get regular exercise. This is one of the most important things you can do for your health.  Most adults should exercise for at least 150 minutes each week. The exercise should increase your heart rate and make you sweat (moderate-intensity exercise).  Most adults should also do strengthening exercises at least twice a week. This is in addition to the moderate-intensity exercise.  Maintain a healthy weight  Body mass index (BMI) is a measurement that can be used to identify possible weight problems. It estimates body fat based on height and weight. Your health care provider can help determine your BMI and help you achieve or maintain a healthy weight.  For females 25 years of age and older:   A BMI below 18.5 is considered underweight.  A BMI of 18.5 to 24.9 is normal.  A BMI of 25 to 29.9 is considered overweight.  A BMI of 30 and above is considered obese.  Watch levels of cholesterol and blood lipids  You should start having your blood tested for lipids and cholesterol at 43 years of age, then have this test every 5 years.  You may need to have your cholesterol levels checked more often if:  Your lipid or  cholesterol levels are high.  You are older than 43 years of age.  You are at high risk for heart disease.  CANCER SCREENING   Lung Cancer  Lung cancer screening is recommended for adults 97-92 years old who are at high risk for lung cancer because of a history of smoking.  A yearly low-dose CT scan of the lungs is recommended for people who:  Currently smoke.  Have quit within the past 15 years.  Have at least a 30-pack-year history of smoking. A pack year is smoking an average of one pack of cigarettes a day for 1 year.  Yearly screening should continue until it has been 15 years since you quit.  Yearly screening should stop if you develop a health problem that would prevent you from having lung cancer treatment.  Breast Cancer  Practice breast self-awareness. This means understanding how your breasts normally appear and feel.  It also means doing regular breast self-exams. Let your health care provider know about any changes, no matter how small.  If you are in your 20s or 30s, you should have a clinical breast exam (CBE) by a health care provider every 1-3 years as part of a regular health exam.  If you are 76 or older, have a CBE every year. Also consider having a breast X-ray (mammogram) every year.  If you have a family history of breast cancer, talk to your health care provider about genetic screening.  If you are  at high risk for breast cancer, talk to your health care provider about having an MRI and a mammogram every year.  Breast cancer gene (BRCA) assessment is recommended for women who have family members with BRCA-related cancers. BRCA-related cancers include:  Breast.  Ovarian.  Tubal.  Peritoneal cancers.  Results of the assessment will determine the need for genetic counseling and BRCA1 and BRCA2 testing. Cervical Cancer Routine pelvic examinations to screen for cervical cancer are no longer recommended for nonpregnant women who are considered low  risk for cancer of the pelvic organs (ovaries, uterus, and vagina) and who do not have symptoms. A pelvic examination may be necessary if you have symptoms including those associated with pelvic infections. Ask your health care provider if a screening pelvic exam is right for you.   The Pap test is the screening test for cervical cancer for women who are considered at risk.  If you had a hysterectomy for a problem that was not cancer or a condition that could lead to cancer, then you no longer need Pap tests.  If you are older than 65 years, and you have had normal Pap tests for the past 10 years, you no longer need to have Pap tests.  If you have had past treatment for cervical cancer or a condition that could lead to cancer, you need Pap tests and screening for cancer for at least 20 years after your treatment.  If you no longer get a Pap test, assess your risk factors if they change (such as having a new sexual partner). This can affect whether you should start being screened again.  Some women have medical problems that increase their chance of getting cervical cancer. If this is the case for you, your health care provider may recommend more frequent screening and Pap tests.  The human papillomavirus (HPV) test is another test that may be used for cervical cancer screening. The HPV test looks for the virus that can cause cell changes in the cervix. The cells collected during the Pap test can be tested for HPV.  The HPV test can be used to screen women 30 years of age and older. Getting tested for HPV can extend the interval between normal Pap tests from three to five years.  An HPV test also should be used to screen women of any age who have unclear Pap test results.  After 43 years of age, women should have HPV testing as often as Pap tests.  Colorectal Cancer  This type of cancer can be detected and often prevented.  Routine colorectal cancer screening usually begins at 43 years of  age and continues through 43 years of age.  Your health care provider may recommend screening at an earlier age if you have risk factors for colon cancer.  Your health care provider may also recommend using home test kits to check for hidden blood in the stool.  A small camera at the end of a tube can be used to examine your colon directly (sigmoidoscopy or colonoscopy). This is done to check for the earliest forms of colorectal cancer.  Routine screening usually begins at age 50.  Direct examination of the colon should be repeated every 5-10 years through 43 years of age. However, you may need to be screened more often if early forms of precancerous polyps or small growths are found. Skin Cancer  Check your skin from head to toe regularly.  Tell your health care provider about any new moles or changes in   moles, especially if there is a change in a mole's shape or color.  Also tell your health care provider if you have a mole that is larger than the size of a pencil eraser.  Always use sunscreen. Apply sunscreen liberally and repeatedly throughout the day.  Protect yourself by wearing long sleeves, pants, a wide-brimmed hat, and sunglasses whenever you are outside. HEART DISEASE, DIABETES, AND HIGH BLOOD PRESSURE   Have your blood pressure checked at least every 1-2 years. High blood pressure causes heart disease and increases the risk of stroke.  If you are between 75 years and 42 years old, ask your health care provider if you should take aspirin to prevent strokes.  Have regular diabetes screenings. This involves taking a blood sample to check your fasting blood sugar level.  If you are at a normal weight and have a low risk for diabetes, have this test once every three years after 43 years of age.  If you are overweight and have a high risk for diabetes, consider being tested at a younger age or more often. PREVENTING INFECTION  Hepatitis B  If you have a higher risk for  hepatitis B, you should be screened for this virus. You are considered at high risk for hepatitis B if:  You were born in a country where hepatitis B is common. Ask your health care provider which countries are considered high risk.  Your parents were born in a high-risk country, and you have not been immunized against hepatitis B (hepatitis B vaccine).  You have HIV or AIDS.  You use needles to inject street drugs.  You live with someone who has hepatitis B.  You have had sex with someone who has hepatitis B.  You get hemodialysis treatment.  You take certain medicines for conditions, including cancer, organ transplantation, and autoimmune conditions. Hepatitis C  Blood testing is recommended for:  Everyone born from [birth year] through 1965.  Anyone with known risk factors for hepatitis C. Sexually transmitted infections (STIs)  You should be screened for sexually transmitted infections (STIs) including gonorrhea and chlamydia if:  You are sexually active and are younger than 43 years of age.  You are older than 43 years of age and your health care provider tells you that you are at risk for this type of infection.  Your sexual activity has changed since you were last screened and you are at an increased risk for chlamydia or gonorrhea. Ask your health care provider if you are at risk.  If you do not have HIV, but are at risk, it may be recommended that you take a prescription medicine daily to prevent HIV infection. This is called pre-exposure prophylaxis (PrEP). You are considered at risk if:  You are sexually active and do not regularly use condoms or know the HIV status of your partner(s).  You take drugs by injection.  You are sexually active with a partner who has HIV. Talk with your health care provider about whether you are at high risk of being infected with HIV. If you choose to begin PrEP, you should first be tested for HIV. You should then be tested every 3 months for  as long as you are taking PrEP.  PREGNANCY   If you are premenopausal and you may become pregnant, ask your health care provider about preconception counseling.  If you may become pregnant, take 400 to 800 micrograms (mcg) of folic acid every day.  If you want to prevent pregnancy, talk to your  health care provider about birth control (contraception). OSTEOPOROSIS AND MENOPAUSE   Osteoporosis is a disease in which the bones lose minerals and strength with aging. This can result in serious bone fractures. Your risk for osteoporosis can be identified using a bone density scan.  If you are 42 years of age or older, or if you are at risk for osteoporosis and fractures, ask your health care provider if you should be screened.  Ask your health care provider whether you should take a calcium or vitamin D supplement to lower your risk for osteoporosis.  Menopause may have certain physical symptoms and risks.  Hormone replacement therapy may reduce some of these symptoms and risks. Talk to your health care provider about whether hormone replacement therapy is right for you.  HOME CARE INSTRUCTIONS   Schedule regular health, dental, and eye exams.  Stay current with your immunizations.   Do not use any tobacco products including cigarettes, chewing tobacco, or electronic cigarettes.  If you are pregnant, do not drink alcohol.  If you are breastfeeding, limit how much and how often you drink alcohol.  Limit alcohol intake to no more than 1 drink per day for nonpregnant women. One drink equals 12 ounces of beer, 5 ounces of wine, or 1 ounces of hard liquor.  Do not use street drugs.  Do not share needles.  Ask your health care provider for help if you need support or information about quitting drugs.  Tell your health care provider if you often feel depressed.  Tell your health care provider if you have ever been abused or do not feel safe at home. Document Released: 06/23/2011  Document Revised: 04/24/2014 Document Reviewed: 11/09/2013 [facility] Patient Information 2015 [place], [place]. This information is not intended to replace advice given to you by your health care provider. Make sure you discuss any questions you have with your health care provider. Contraception Choices Contraception (birth control) is the use of any methods or devices to prevent pregnancy. Below are some methods to help avoid pregnancy. HORMONAL METHODS   Contraceptive implant. This is a thin, plastic tube containing progesterone hormone. It does not contain estrogen hormone. Your health care provider inserts the tube in the inner part of the upper arm. The tube can remain in place for up to 3 years. After 3 years, the implant must be removed. The implant prevents the ovaries from releasing an egg (ovulation), thickens the cervical mucus to prevent sperm from entering the uterus, and thins the lining of the inside of the uterus.  Progesterone-only injections. These injections are given every 3 months by your health care provider to prevent pregnancy. This synthetic progesterone hormone stops the ovaries from releasing eggs. It also thickens cervical mucus and changes the uterine lining. This makes it harder for sperm to survive in the uterus.  Birth control pills. These pills contain estrogen and progesterone hormone. They work by preventing the ovaries from releasing eggs (ovulation). They also cause the cervical mucus to thicken, preventing the sperm from entering the uterus. Birth control pills are prescribed by a health care provider.Birth control pills can also be used to treat heavy periods.  Minipill. This type of birth control pill contains only the progesterone hormone. They are taken every day of each month and must be prescribed by your health care provider.  Birth control patch. The patch contains hormones similar to those in birth control pills. It must be changed once a week and is  prescribed by a health care  provider.  Vaginal ring. The ring contains hormones similar to those in birth control pills. It is left in the vagina for 3 weeks, removed for 1 week, and then a new one is put back in place. The patient must be comfortable inserting and removing the ring from the vagina.A health care provider's prescription is necessary.  Emergency contraception. Emergency contraceptives prevent pregnancy after unprotected sexual intercourse. This pill can be taken right after sex or up to 5 days after unprotected sex. It is most effective the sooner you take the pills after having sexual intercourse. Most emergency contraceptive pills are available without a prescription. Check with your pharmacist. Do not use emergency contraception as your only form of birth control. BARRIER METHODS   Female condom. This is a thin sheath (latex or rubber) that is worn over the penis during sexual intercourse. It can be used with spermicide to increase effectiveness.  Female condom. This is a soft, loose-fitting sheath that is put into the vagina before sexual intercourse.  Diaphragm. This is a soft, latex, dome-shaped barrier that must be fitted by a health care provider. It is inserted into the vagina, along with a spermicidal jelly. It is inserted before intercourse. The diaphragm should be left in the vagina for 6 to 8 hours after intercourse.  Cervical cap. This is a round, soft, latex or plastic cup that fits over the cervix and must be fitted by a health care provider. The cap can be left in place for up to 48 hours after intercourse.  Sponge. This is a soft, circular piece of polyurethane foam. The sponge has spermicide in it. It is inserted into the vagina after wetting it and before sexual intercourse.  Spermicides. These are chemicals that kill or block sperm from entering the cervix and uterus. They come in the form of creams, jellies, suppositories, foam, or tablets. They do not require a  prescription. They are inserted into the vagina with an applicator before having sexual intercourse. The process must be repeated every time you have sexual intercourse. INTRAUTERINE CONTRACEPTION  Intrauterine device (IUD). This is a T-shaped device that is put in a woman's uterus during a menstrual period to prevent pregnancy. There are 2 types:  Copper IUD. This type of IUD is wrapped in copper wire and is placed inside the uterus. Copper makes the uterus and fallopian tubes produce a fluid that kills sperm. It can stay in place for 10 years.  Hormone IUD. This type of IUD contains the hormone progestin (synthetic progesterone). The hormone thickens the cervical mucus and prevents sperm from entering the uterus, and it also thins the uterine lining to prevent implantation of a fertilized egg. The hormone can weaken or kill the sperm that get into the uterus. It can stay in place for 3-5 years, depending on which type of IUD is used. PERMANENT METHODS OF CONTRACEPTION  Female tubal ligation. This is when the woman's fallopian tubes are surgically sealed, tied, or blocked to prevent the egg from traveling to the uterus.  Hysteroscopic sterilization. This involves placing a small coil or insert into each fallopian tube. Your doctor uses a technique called hysteroscopy to do the procedure. The device causes scar tissue to form. This results in permanent blockage of the fallopian tubes, so the sperm cannot fertilize the egg. It takes about 3 months after the procedure for the tubes to become blocked. You must use another form of birth control for these 3 months.  Female sterilization. This is when the  female has the tubes that carry sperm tied off (vasectomy).This blocks sperm from entering the vagina during sexual intercourse. After the procedure, the man can still ejaculate fluid (semen). NATURAL PLANNING METHODS  Natural family planning. This is not having sexual intercourse or using a barrier method  (condom, diaphragm, cervical cap) on days the woman could become pregnant.  Calendar method. This is keeping track of the length of each menstrual cycle and identifying when you are fertile.  Ovulation method. This is avoiding sexual intercourse during ovulation.  Symptothermal method. This is avoiding sexual intercourse during ovulation, using a thermometer and ovulation symptoms.  Post-ovulation method. This is timing sexual intercourse after you have ovulated. Regardless of which type or method of contraception you choose, it is important that you use condoms to protect against the transmission of sexually transmitted infections (STIs). Talk with your health care provider about which form of contraception is most appropriate for you. Document Released: 12/08/2005 Document Revised: 12/13/2013 Document Reviewed: 06/02/2013 [facility] Patient Information 2015 [place], [place]. This information is not intended to replace advice given to you by your health care provider. Make sure you discuss any questions you have with your health care provider.

## 2014-07-26 NOTE — Progress Notes (Signed)
Subjective:     [name] is a 43 y.o. female here for a routine exam.  Current complaints: none.    Personal health questionnaire:  Is patient Ashkenazi Jewish, have a family history of breast and/or ovarian cancer: no Is there a family history of uterine cancer diagnosed at age < 14, gastrointestinal cancer, urinary tract cancer, family member who is a Personnel officer syndrome-associated carrier: no Is the patient overweight and hypertensive, family history of diabetes, personal history of gestational diabetes or PCOS: yes Is patient over 74, have PCOS,  family history of premature CHD under age 44, diabetes, smoke, have hypertension or peripheral artery disease:  no At any time, has a partner hit, kicked or otherwise hurt or frightened you?: no Over the past 2 weeks, have you felt down, depressed or hopeless?: no Over the past 2 weeks, have you felt little interest or pleasure in doing things?:no   Gynecologic History No LMP recorded. Patient has had an injection. Contraception: Depo-Provera injections  Last Pap results were: normal   Obstetric History OB History  Gravida Para Term Preterm AB SAB TAB Ectopic Multiple Living  2 2 2            # Outcome Date GA Lbr Len/2nd Weight Sex Delivery Anes PTL Lv  2 TRM           1 TRM               Past Medical History  Diagnosis Date  . Anemia   . Heart murmur     Past Surgical History  Procedure Laterality Date  . Cesarean section  [id number]  . Cholecystectomy      07/19/12    Current outpatient prescriptions:cholecalciferol (VITAMIN D) 1000 UNITS tablet, Take 1,000 Units by mouth daily., Disp: , Rfl: ;  medroxyPROGESTERone (DEPO-PROVERA) 150 MG/ML injection, BRING TO OFFICE, Disp: 1 mL, Rfl: 4 Current facility-administered medications:medroxyPROGESTERone (DEPO-PROVERA) injection 150 mg, 150 mg, Intramuscular, Q90 days, Antionette Char, MD, 150 mg at 05/11/14 1047 No Known Allergies  History  Substance Use Topics  . Smoking  status: Former Smoker    Quit date: 05/26/2005  . Smokeless tobacco: Never Used  . Alcohol Use: Yes     Comment: occ    Family History  Problem Relation Age of Onset  . Aneurysm Mother       Review of Systems  Constitutional: negative for fatigue and weight loss Respiratory: negative for cough and wheezing Cardiovascular: negative for chest pain, fatigue and palpitations Gastrointestinal: negative for abdominal pain and change in bowel habits Musculoskeletal:negative for myalgias Neurological: negative for gait problems and tremors Behavioral/Psych: negative for abusive relationship, depression Endocrine: negative for temperature intolerance   Genitourinary:negative for abnormal menstrual periods, genital lesions, hot flashes, sexual problems and vaginal discharge Integument/breast: negative for breast lump, breast tenderness, nipple discharge and skin lesion(s)    Objective:       BP 140/116  Pulse 78  Temp(Src) 97.8 F (36.6 C)  Ht 5\' 4"  (1.626 m)  Wt 84.823 kg (187 lb)  BMI 32.08 kg/m2 General:   alert  Skin:   no rash or abnormalities  Lungs:   clear to auscultation bilaterally  Heart:   regular rate and rhythm, S1, S2 normal, no murmur, click, rub or gallop  Breasts:   normal without suspicious masses, skin or nipple changes or axillary nodes  Abdomen:  normal findings: no organomegaly, soft, non-tender and no hernia  Pelvis:  External genitalia: normal general appearance Urinary system: urethral  meatus normal and bladder without fullness, nontender Vaginal: normal without tenderness, induration or masses Cervix: normal appearance Adnexa: normal bimanual exam Uterus: anteverted and non-tender, normal size   Lab Review  Labs reviewed no Radiologic studies reviewed no    Assessment:    Healthy female exam.    Plan:    Education reviewed: calcium supplements, low fat, low cholesterol diet, safe sex/STD prevention and weight bearing exercise.    Possible management options include: alternative to Depo provera for contraception--considering a LARC Follow up as needed.

## 2014-07-27 LAB — RPR

## 2014-07-27 LAB — HIV ANTIBODY (ROUTINE TESTING W REFLEX): HIV: NONREACTIVE

## 2014-07-28 LAB — PAP IG, CT-NG NAA, HPV HIGH-RISK
Chlamydia Probe Amp: NEGATIVE
GC Probe Amp: NEGATIVE
HPV DNA HIGH RISK: NOT DETECTED

## 2014-08-02 ENCOUNTER — Ambulatory Visit (INDEPENDENT_AMBULATORY_CARE_PROVIDER_SITE_OTHER): Payer: Medicaid Other | Admitting: *Deleted

## 2014-08-02 ENCOUNTER — Other Ambulatory Visit: Payer: Self-pay

## 2014-08-02 VITALS — BP 145/90 | HR 84 | Wt 188.0 lb

## 2014-08-02 DIAGNOSIS — Z309 Encounter for contraceptive management, unspecified: Secondary | ICD-10-CM

## 2014-08-02 DIAGNOSIS — Z3049 Encounter for surveillance of other contraceptives: Secondary | ICD-10-CM

## 2014-08-02 DIAGNOSIS — R399 Unspecified symptoms and signs involving the genitourinary system: Secondary | ICD-10-CM

## 2014-08-02 DIAGNOSIS — Z3042 Encounter for surveillance of injectable contraceptive: Secondary | ICD-10-CM

## 2014-08-02 NOTE — Progress Notes (Signed)
Pt is in office today for depo injection.  Depo was given in right upper outer quadrant.  Pt tolerated well.  Pt states that it was suggested that she switch birth control due to bone density.  Pt will speak with scheduling nurse today to set up appt for Nexplanon insertion.  Pt states that she has been having some discomfort and burning with urination.   Pt ask that her urine be checked to see if possible UTI.  A urine sample was obtained and pt made aware that it could be sent for culture and she will be made aware of results.  BP 145/90  Pulse 84  Wt 188 lb (85.276 kg)  Administrations This Visit   medroxyPROGESTERone (DEPO-PROVERA) injection 150 mg   Administered Action Dose Route Administered By   08/02/2014 Given 150 mg Intramuscular Lanney GinsSuzanne D Foster, CMA

## 2014-08-02 NOTE — Addendum Note (Signed)
Addended by: Marya LandryFOSTER, Anaston Koehn D on: 08/02/2014 09:39 AM   Modules accepted: Orders

## 2014-08-02 NOTE — Progress Notes (Unsigned)
Pt was in office earlier today for depo injection and had requested urine be checked for possible UTI. Error in processing order, pt was added to schedule again to be able to obtain order.

## 2014-08-03 LAB — URINE CULTURE

## 2014-08-21 ENCOUNTER — Ambulatory Visit (INDEPENDENT_AMBULATORY_CARE_PROVIDER_SITE_OTHER): Payer: Medicaid Other | Admitting: Obstetrics & Gynecology

## 2014-08-21 ENCOUNTER — Encounter: Payer: Self-pay | Admitting: Obstetrics & Gynecology

## 2014-08-21 VITALS — BP 120/82 | Temp 98.6°F | Ht 65.0 in | Wt 191.0 lb

## 2014-08-21 DIAGNOSIS — Z308 Encounter for other contraceptive management: Secondary | ICD-10-CM

## 2014-08-21 DIAGNOSIS — Z3202 Encounter for pregnancy test, result negative: Secondary | ICD-10-CM

## 2014-08-21 DIAGNOSIS — Z30017 Encounter for initial prescription of implantable subdermal contraceptive: Secondary | ICD-10-CM

## 2014-08-21 DIAGNOSIS — Z01812 Encounter for preprocedural laboratory examination: Secondary | ICD-10-CM

## 2014-08-21 LAB — POCT URINE PREGNANCY: PREG TEST UR: NEGATIVE

## 2014-08-21 MED ORDER — ETONOGESTREL 68 MG ~~LOC~~ IMPL
68.0000 mg | DRUG_IMPLANT | Freq: Once | SUBCUTANEOUS | Status: AC
  Administered 2014-08-21: 68 mg via SUBCUTANEOUS

## 2014-08-22 NOTE — Progress Notes (Signed)
NEXPLANON INSERTION NOTE BP 120/82  Temp(Src) 98.6 F (37 C)  Ht  (1.651 m)  Wt 86.637 kg (191 lb)  BMI 31.78 kg/m2   Contraception used: Hormonal Contraception: Injection, Rings and Patches  Pregnancy test result:  Lab Results  Component Value Date   PREGTESTUR Negative 08/21/2014    Indications:  The patient desires contraception.  She understands risks, benefits, and alternatives to Implanon and would like to proceed.  Anesthesia:   Lidocaine 1% plain.  Procedure:  A time-out was performed confirming the procedure and the patient's allergy status.  The patient's non-dominant was identified as the left arm.  The protection cap was removed. While placing countertraction on the skin, the needle was inserted at a 30 degree angle.  The applicator was held horizontal to the skin; the skin was tented upward as the needle was introduced into the subdermal space.  While holding the applicator in place, the slider was unlocked. The Nexplanon was removed from the field.  The Nexplanon was palpated to ensure proper placement.  Complications: None  Instructions:  The patient was instructed to remove the dressing in 24 hours and that some bruising is to be expected.  She was advised to use over the counter analgesics as needed for any pain at the site.  She is to keep the area dry for 24 hours and to call if her hand or arm becomes cold, numb, or blue.  Return visit:  Return in prn

## 2014-10-23 ENCOUNTER — Encounter: Payer: Self-pay | Admitting: Obstetrics & Gynecology

## 2014-12-18 ENCOUNTER — Encounter: Payer: Self-pay | Admitting: *Deleted

## 2014-12-19 ENCOUNTER — Encounter: Payer: Self-pay | Admitting: Obstetrics & Gynecology

## 2017-08-27 ENCOUNTER — Ambulatory Visit (INDEPENDENT_AMBULATORY_CARE_PROVIDER_SITE_OTHER): Payer: BLUE CROSS/BLUE SHIELD | Admitting: Obstetrics

## 2017-08-27 ENCOUNTER — Other Ambulatory Visit (HOSPITAL_COMMUNITY)
Admission: RE | Admit: 2017-08-27 | Discharge: 2017-08-27 | Disposition: A | Discharge status: Home / Self Care | Payer: BLUE CROSS/BLUE SHIELD | Source: Ambulatory Visit | Attending: Obstetrics | Admitting: Obstetrics

## 2017-08-27 ENCOUNTER — Encounter: Payer: Self-pay | Admitting: Obstetrics

## 2017-08-27 VITALS — BP 123/74 | HR 82 | Ht 65.0 in | Wt 175.2 lb

## 2017-08-27 DIAGNOSIS — Z113 Encounter for screening for infections with a predominantly sexual mode of transmission: Secondary | ICD-10-CM

## 2017-08-27 DIAGNOSIS — Z1239 Encounter for other screening for malignant neoplasm of breast: Secondary | ICD-10-CM

## 2017-08-27 DIAGNOSIS — Z01419 Encounter for gynecological examination (general) (routine) without abnormal findings: Secondary | ICD-10-CM | POA: Insufficient documentation

## 2017-08-27 DIAGNOSIS — N898 Other specified noninflammatory disorders of vagina: Secondary | ICD-10-CM

## 2017-08-27 DIAGNOSIS — Z3046 Encounter for surveillance of implantable subdermal contraceptive: Secondary | ICD-10-CM

## 2017-08-27 NOTE — Progress Notes (Signed)
Patient is in the office to discuss removal and insertion of Nexplanon   Subjective:        [name] is a 46 y.o. female here for a routine exam.  Current complaints: None.    Personal health questionnaire:  Is patient Ashkenazi Jewish, have a family history of breast and/or ovarian cancer: no Is there a family history of uterine cancer diagnosed at age < 4050, gastrointestinal cancer, urinary tract cancer, family member who is a Personnel officerLynch syndrome-associated carrier: no Is the patient overweight and hypertensive, family history of diabetes, personal history of gestational diabetes, preeclampsia or PCOS: no Is patient over 3655, have PCOS,  family history of premature CHD under age 46, diabetes, smoke, have hypertension or peripheral artery disease:  no At any time, has a partner hit, kicked or otherwise hurt or frightened you?: no Over the past 2 weeks, have you felt down, depressed or hopeless?: no Over the past 2 weeks, have you felt little interest or pleasure in doing things?:no   Gynecologic History No LMP recorded. Patient has had an implant. Contraception: Nexplanon Last Pap: 2015. Results were: normal Last mammogram: 2013. Results were: normal  Obstetric History OB History  Gravida Para Term Preterm AB Living  2 2 2         SAB TAB Ectopic Multiple Live Births               # Outcome Date GA Lbr Len/2nd Weight Sex Delivery Anes PTL Lv  2 Term           1 Term               Past Medical History:  Diagnosis Date  . Anemia   . Heart murmur     Past Surgical History:  Procedure Laterality Date  . CESAREAN SECTION  [id number]  . CHOLECYSTECTOMY     07/19/12     Current Outpatient Prescriptions:  .  etonogestrel (NEXPLANON) 68 MG IMPL implant, 1 each by Subdermal route once., Disp: , Rfl:  .  Multiple Vitamins-Minerals (MULTIVITAMIN WITH MINERALS) tablet, Take 1 tablet by mouth daily., Disp: , Rfl:  No Known Allergies  Social History  Substance Use Topics  .  Smoking status: Current Some Day Smoker    Last attempt to quit: 05/26/2005  . Smokeless tobacco: Never Used  . Alcohol use Yes     Comment: occ    Family History  Problem Relation Age of Onset  . Aneurysm Mother       Review of Systems  Constitutional: negative for fatigue and weight loss Respiratory: negative for cough and wheezing Cardiovascular: negative for chest pain, fatigue and palpitations Gastrointestinal: negative for abdominal pain and change in bowel habits Musculoskeletal:negative for myalgias Neurological: negative for gait problems and tremors Behavioral/Psych: negative for abusive relationship, depression Endocrine: negative for temperature intolerance    Genitourinary:negative for abnormal menstrual periods, genital lesions, hot flashes, sexual problems and vaginal discharge Integument/breast: negative for breast lump, breast tenderness, nipple discharge and skin lesion(s)    Objective:       BP 123/74   Pulse 82   Ht 5\' 5"  (1.651 m)   Wt 175 lb 3.2 oz (79.5 kg)   BMI 29.15 kg/m  General:   alert  Skin:   no rash or abnormalities  Lungs:   clear to auscultation bilaterally  Heart:   regular rate and rhythm, S1, S2 normal, no murmur, click, rub or gallop  Breasts:   normal without suspicious masses,  skin or nipple changes or axillary nodes  Abdomen:  normal findings: no organomegaly, soft, non-tender and no hernia  Pelvis:  External genitalia: normal general appearance Urinary system: urethral meatus normal and bladder without fullness, nontender Vaginal: normal without tenderness, induration or masses Cervix: normal appearance Adnexa: normal bimanual exam Uterus: anteverted and non-tender, normal size   Lab Review Urine pregnancy test Labs reviewed yes Radiologic studies reviewed yes  50% of 20 min visit spent on counseling and coordination of care.    Assessment:     1. Encounter for routine gynecological examination with Papanicolaou smear of  cervix Rx: - Cytology - PAP  2. Encounter for surveillance of implantable subdermal contraceptive   3. Vaginal discharge Rx: - Cervicovaginal ancillary only  4. Screening breast examination Rx: - MM Digital Screening; Future   Plan:    Education reviewed: calcium supplements, depression evaluation, low fat, low cholesterol diet, self breast exams and weight bearing exercise. Contraception: Nexplanon. Mammogram ordered. Follow up in: 2 weeks.  Nexplanon removal and reinsertion.  Meds ordered this encounter  Medications  . Multiple Vitamins-Minerals (MULTIVITAMIN WITH MINERALS) tablet    Sig: Take 1 tablet by mouth daily.  Marland Kitchen etonogestrel (NEXPLANON) 68 MG IMPL implant    Sig: 1 each by Subdermal route once.   Orders Placed This Encounter  Procedures  . MM Digital Screening    Standing Status:   Future    Standing Expiration Date:   10/27/2018    Order Specific Question:   Reason for Exam (SYMPTOM  OR DIAGNOSIS REQUIRED)    Answer:   Screening.    Order Specific Question:   Is the patient pregnant?    Answer:   No    Order Specific Question:   Preferred imaging location?    Answer:   [facility]     . She is also due her annual exam.

## 2017-08-28 LAB — CYTOLOGY - PAP
DIAGNOSIS: NEGATIVE
HPV: NOT DETECTED

## 2017-08-28 LAB — CERVICOVAGINAL ANCILLARY ONLY
Chlamydia: NEGATIVE
Neisseria Gonorrhea: NEGATIVE

## 2017-09-11 ENCOUNTER — Encounter: Payer: Self-pay | Admitting: Obstetrics

## 2017-09-11 ENCOUNTER — Ambulatory Visit (INDEPENDENT_AMBULATORY_CARE_PROVIDER_SITE_OTHER): Payer: BLUE CROSS/BLUE SHIELD | Admitting: Obstetrics

## 2017-09-11 VITALS — BP 131/70 | HR 80 | Ht 65.0 in | Wt 170.8 lb

## 2017-09-11 DIAGNOSIS — Z30017 Encounter for initial prescription of implantable subdermal contraceptive: Secondary | ICD-10-CM

## 2017-09-11 DIAGNOSIS — Z308 Encounter for other contraceptive management: Secondary | ICD-10-CM | POA: Diagnosis not present

## 2017-09-11 DIAGNOSIS — Z3046 Encounter for surveillance of implantable subdermal contraceptive: Secondary | ICD-10-CM

## 2017-09-11 LAB — POCT URINE PREGNANCY: Preg Test, Ur: NEGATIVE

## 2017-09-11 MED ORDER — ETONOGESTREL 68 MG ~~LOC~~ IMPL: 68.0000 mg | DRUG_IMPLANT | Freq: Once | SUBCUTANEOUS | Status: DC

## 2017-09-11 NOTE — Progress Notes (Signed)
Nexplanon Procedure Note    PROCEDURE: Nexplanon removal and placement Performing Provider: Brock Bad MD  Patient education prior to procedure, explained risk, benefits of Nexplanon, reviewed alternative options. Patient reported understanding. Gave consent to continue with procedure.  Patient had Nexplanon inserted in 2015. Desires removal today w/ reinsertion  PROCEDURE:  Pregnancy Text :  not indicated Site (check):      left arm         Sterile Preparation:   Betadinex3 Lot # [id number] Expiration Date 01 / 2021    The patient's left arm was palpated and the implant device located. The area was prepped with Betadinex3. The distal end of the device was palpated and 1.5 cc of 1% lidocaine without epinephrine was injected. A 1.5 mm incision was made. Any fibrotic tissue was carefully dissected away using blunt and/or sharp dissection. The device was removed in an intact manner.   Insertion site was the same as the removal site. Nexplanon  was inserted subcutaneously.Needle was removed from the insertion site. Nexplanon capsule was palpated by provider and patient to assure satisfactory placement. Dressing applied.  Followup: The patient tolerated the procedure well without complications.  Standard post-procedure care is explained and return precautions are given.  Brock Bad MD

## 2017-09-11 NOTE — Progress Notes (Signed)
Patient is in the office for nexplanon removal and re-insertion 

## 2017-09-14 ENCOUNTER — Encounter: Payer: Self-pay | Admitting: *Deleted

## 2017-09-15 ENCOUNTER — Ambulatory Visit
Admission: RE | Admit: 2017-09-15 | Discharge: 2017-09-15 | Disposition: A | Discharge status: Home / Self Care | Payer: BLUE CROSS/BLUE SHIELD | Source: Ambulatory Visit | Attending: Obstetrics | Admitting: Obstetrics

## 2017-09-15 DIAGNOSIS — Z1239 Encounter for other screening for malignant neoplasm of breast: Secondary | ICD-10-CM

## 2017-09-25 ENCOUNTER — Ambulatory Visit (INDEPENDENT_AMBULATORY_CARE_PROVIDER_SITE_OTHER): Payer: BLUE CROSS/BLUE SHIELD | Admitting: Obstetrics

## 2017-09-25 ENCOUNTER — Encounter: Payer: Self-pay | Admitting: Obstetrics

## 2017-09-25 VITALS — BP 119/76 | HR 60 | Wt 173.0 lb

## 2017-09-25 DIAGNOSIS — Z3046 Encounter for surveillance of implantable subdermal contraceptive: Secondary | ICD-10-CM | POA: Diagnosis not present

## 2017-09-25 NOTE — Progress Notes (Signed)
Subjective:    [name] is a 46 y.o. female who presents for contraception counseling. The patient has no complaints today. The patient is sexually active. Pertinent past medical history: current smoker.  The information documented in the HPI was reviewed and verified.  Menstrual History: OB History    Gravida Para Term Preterm AB Living   SAB TAB Ectopic Multiple Live Births           2       No LMP recorded. Patient has had an implant.   Patient Active Problem List   Diagnosis Date Noted  . Symptomatic cholelithiasis 05/26/2012   Past Medical History:  Diagnosis Date  . Anemia   . Heart murmur     Past Surgical History:  Procedure Laterality Date  . CESAREAN SECTION  [id number]  . CHOLECYSTECTOMY     07/19/12     Current Outpatient Prescriptions:  .  etonogestrel (NEXPLANON) 68 MG IMPL implant, 1 each by Subdermal route once., Disp: , Rfl:  .  Multiple Vitamins-Minerals (MULTIVITAMIN WITH MINERALS) tablet, Take 1 tablet by mouth daily., Disp: , Rfl:   Current Facility-Administered Medications:  .  etonogestrel (NEXPLANON) implant 68 mg, 68 mg, Subdermal, Once, Brock Bad, MD No Known Allergies  Social History  Substance Use Topics  . Smoking status: Current Some Day Smoker    Last attempt to quit: 05/26/2005  . Smokeless tobacco: Never Used  . Alcohol use Yes     Comment: occ    Family History  Problem Relation Age of Onset  . Aneurysm Mother   . Breast cancer Neg Hx        Review of Systems Constitutional: negative for weight loss Genitourinary:negative for abnormal menstrual periods and vaginal discharge   Objective:   BP 119/76   Pulse 60   Wt 173 lb (78.5 kg)   BMI 28.79 kg/m    PE:          General:  Alert and no distress          LUE:  Nexplanon removal and reinsertion site is C, D, I and non tender.  Suture removed.    Lab Review Urine pregnancy test Labs reviewed yes Radiologic studies reviewed no  50% of  15 min visit spent on counseling and coordination of care.    Assessment:    46 y.o., continuing Nexplanon, no contraindications.   Plan:    All questions answered. Contraception: Nexplanon. Discussed healthy lifestyle modifications. Follow up in 1 year.  No orders of the defined types were placed in this encounter.  No orders of the defined types were placed in this encounter.

## 2019-09-12 ENCOUNTER — Encounter: Payer: Self-pay | Admitting: Advanced Practice Midwife

## 2019-09-12 ENCOUNTER — Other Ambulatory Visit: Payer: Self-pay

## 2019-09-12 ENCOUNTER — Other Ambulatory Visit: Payer: Self-pay | Admitting: Advanced Practice Midwife

## 2019-09-12 ENCOUNTER — Other Ambulatory Visit (HOSPITAL_COMMUNITY)
Admission: RE | Admit: 2019-09-12 | Discharge: 2019-09-12 | Disposition: A | Discharge status: Home / Self Care | Payer: Managed Care, Other (non HMO) | Source: Ambulatory Visit | Attending: Advanced Practice Midwife | Admitting: Advanced Practice Midwife

## 2019-09-12 ENCOUNTER — Ambulatory Visit (INDEPENDENT_AMBULATORY_CARE_PROVIDER_SITE_OTHER): Payer: Medicaid Other | Admitting: Advanced Practice Midwife

## 2019-09-12 VITALS — BP 143/76 | HR 62 | Ht 65.0 in | Wt 182.1 lb

## 2019-09-12 DIAGNOSIS — Z1239 Encounter for other screening for malignant neoplasm of breast: Secondary | ICD-10-CM

## 2019-09-12 DIAGNOSIS — Z113 Encounter for screening for infections with a predominantly sexual mode of transmission: Secondary | ICD-10-CM | POA: Diagnosis present

## 2019-09-12 DIAGNOSIS — Z01419 Encounter for gynecological examination (general) (routine) without abnormal findings: Secondary | ICD-10-CM

## 2019-09-12 DIAGNOSIS — Z0000 Encounter for general adult medical examination without abnormal findings: Secondary | ICD-10-CM

## 2019-09-12 DIAGNOSIS — N939 Abnormal uterine and vaginal bleeding, unspecified: Secondary | ICD-10-CM

## 2019-09-12 NOTE — Progress Notes (Signed)
Subjective:     [name] is a 48 y.o. female here for a routine exam.  Current complaints: longer, heavier periods in June and July.  She reports her menses are irregular and light, spotting only but in June and July she had heavier periods that lasted 4-5 days. She denies any bleeding since July.  Personal health questionnaire reviewed: yes.   Do you have a primary care provider? No, list of providers given How many times per week do you exercise? Not regularly Do you feel safe at home? Yes Has anyone hit, slapped, or kicked you recently? No Do you feel sad, tired, or upset most days or are you mostly happy with life? Mostly happy  Gynecologic History Patient's last menstrual period was 07/08/2019. Contraception: Nexplanon Last Pap: 2018. Results were: normal Last mammogram: 2018. Results were: normal  Obstetric History OB History  Gravida Para Term Preterm AB Living  2 2 2     2   SAB TAB Ectopic Multiple Live Births          2    # Outcome Date GA Lbr Len/2nd Weight Sex Delivery Anes PTL Lv  2 Term 05/17/07    F Vag-Spont   LIV  1 Term 04/03/03    F CS-LTranv   LIV     The following portions of the patient's history were reviewed and updated as appropriate: allergies, current medications, past family history, past medical history, past social history, past surgical history and problem list.  Review of Systems Pertinent items noted in HPI and remainder of comprehensive ROS otherwise negative.    Objective:   BP (!) 143/76   Pulse 62   Ht 5\' 5"  (1.651 m)   Wt 182 lb 1.3 oz (82.6 kg)   LMP 07/08/2019   BMI 30.30 kg/m   VS reviewed, nursing note reviewed,  Constitutional: well developed, well nourished, no distress HEENT: normocephalic CV: normal rate Pulm/chest wall: normal effort Breast Exam:  right breast normal without mass, skin or nipple changes or axillary nodes, left breast normal without mass, skin or nipple changes or axillary nodes Abdomen:  soft Neuro: alert and oriented x 3 Skin: warm, dry Psych: affect normal Pelvic exam: Deferred     Assessment/Plan:   1. Screening for breast cancer  - MM DIGITAL SCREENING BILATERAL; Future  2. Routine screening for STI (sexually transmitted infection)  - HIV Antibody (routine testing w rflx) - Hepatitis C antibody - RPR - Cervicovaginal ancillary only( [facility])  3. Well woman exam with routine gynecological exam --Encouraged establishing with PCP, BP slightly elevated today, no diagnosis of HTN.  4. Abnormal uterine bleeding (AUB) --No abnormal bleeding x 2 months and 2 unusual cycles were during time of increased work hours, physical job.  May have been changes in physical activity, may be signs of perimenopause.     Contraception: Nexplanon  Follow up in: 1 year for Nexplanon replacement or as needed.   Fatima Blank, CNM 3:16 PM

## 2019-09-13 LAB — HEPATITIS C ANTIBODY: Hep C Virus Ab: <0.1 s/co ratio (ref 0.0–0.9)

## 2019-09-13 LAB — HIV ANTIBODY (ROUTINE TESTING W REFLEX): HIV Screen 4th Generation wRfx: NONREACTIVE

## 2019-09-13 LAB — RPR: RPR Ser Ql: NONREACTIVE

## 2019-09-14 ENCOUNTER — Other Ambulatory Visit: Payer: Self-pay | Admitting: Advanced Practice Midwife

## 2019-09-14 LAB — CERVICOVAGINAL ANCILLARY ONLY
Bacterial Vaginitis (gardnerella): POSITIVE — AB
Candida Glabrata: NEGATIVE
Candida Vaginitis: NEGATIVE
Molecular Disclaimer: NEGATIVE
Molecular Disclaimer: NEGATIVE
Molecular Disclaimer: NEGATIVE
Molecular Disclaimer: NORMAL
Trichomonas: NEGATIVE

## 2019-09-14 MED ORDER — METRONIDAZOLE 500 MG PO TABS: 500.0000 mg | ORAL_TABLET | Freq: Two times a day (BID) | ORAL | 0 refills | Status: AC

## 2019-09-15 LAB — CERVICOVAGINAL ANCILLARY ONLY
Chlamydia: NEGATIVE
Neisseria Gonorrhea: NEGATIVE

## 2020-09-25 ENCOUNTER — Other Ambulatory Visit (HOSPITAL_COMMUNITY)
Admission: RE | Admit: 2020-09-25 | Discharge: 2020-09-25 | Disposition: A | Discharge status: Home / Self Care | Payer: Managed Care, Other (non HMO) | Source: Ambulatory Visit | Attending: Obstetrics | Admitting: Obstetrics

## 2020-09-25 ENCOUNTER — Ambulatory Visit (INDEPENDENT_AMBULATORY_CARE_PROVIDER_SITE_OTHER): Payer: Managed Care, Other (non HMO) | Admitting: Obstetrics

## 2020-09-25 ENCOUNTER — Encounter: Payer: Self-pay | Admitting: Obstetrics

## 2020-09-25 ENCOUNTER — Other Ambulatory Visit: Payer: Self-pay

## 2020-09-25 VITALS — BP 119/74 | HR 69 | Wt 183.4 lb

## 2020-09-25 DIAGNOSIS — Z1239 Encounter for other screening for malignant neoplasm of breast: Secondary | ICD-10-CM

## 2020-09-25 DIAGNOSIS — Z3046 Encounter for surveillance of implantable subdermal contraceptive: Secondary | ICD-10-CM

## 2020-09-25 DIAGNOSIS — Z01419 Encounter for gynecological examination (general) (routine) without abnormal findings: Secondary | ICD-10-CM

## 2020-09-25 DIAGNOSIS — N898 Other specified noninflammatory disorders of vagina: Secondary | ICD-10-CM | POA: Diagnosis not present

## 2020-09-25 NOTE — Progress Notes (Addendum)
Pt is here for Annual / Pap   Contraception:     Subjective:        [name] is a 49 y.o. female here for a routine exam.  Current complaints: Vaginal discharge.    Personal health questionnaire:  Is patient Ashkenazi Jewish, have a family history of breast and/or ovarian cancer: no Is there a family history of uterine cancer diagnosed at age < 37, gastrointestinal cancer, urinary tract cancer, family member who is a Personnel officer syndrome-associated carrier: no Is the patient overweight and hypertensive, family history of diabetes, personal history of gestational diabetes, preeclampsia or PCOS: no Is patient over 34, have PCOS,  family history of premature CHD under age 33, diabetes, smoke, have hypertension or peripheral artery disease:  no At any time, has a partner hit, kicked or otherwise hurt or frightened you?: no Over the past 2 weeks, have you felt down, depressed or hopeless?: no Over the past 2 weeks, have you felt little interest or pleasure in doing things?:no   Gynecologic History Patient's last menstrual period was 09/22/2020 (approximate). Contraception: Nexplanon Last Pap: 08-27-2017. Results were: normal Last mammogram: 2018. Results were: normal  Obstetric History OB History  Gravida Para Term Preterm AB Living  2 2 2     2   SAB TAB Ectopic Multiple Live Births          2    # Outcome Date GA Lbr Len/2nd Weight Sex Delivery Anes PTL Lv  2 Term 05/17/07    F Vag-Spont   LIV  1 Term 04/03/03    F CS-LTranv   LIV    Past Medical History:  Diagnosis Date  . Anemia   . Heart murmur     Past Surgical History:  Procedure Laterality Date  . CESAREAN SECTION  06/03/03  . CHOLECYSTECTOMY     07/19/12     Current Outpatient Medications:  .  etonogestrel (NEXPLANON) 68 MG IMPL implant, 1 each by Subdermal route once., Disp: , Rfl:  .  Multiple Vitamins-Minerals (MULTIVITAMIN WITH MINERALS) tablet, Take 1 tablet by mouth daily. (Patient not taking: Reported on  09/25/2020), Disp: , Rfl:   Current Facility-Administered Medications:  .  etonogestrel (NEXPLANON) implant 68 mg, 68 mg, Subdermal, Once, 11/25/2020, MD No Known Allergies  Social History   Tobacco Use  . Smoking status: Current Some Day Smoker    Last attempt to quit: 05/26/2005    Years since quitting: 15.3  . Smokeless tobacco: Never Used  Substance Use Topics  . Alcohol use: Yes    Comment: occ    Family History  Problem Relation Age of Onset  . Aneurysm Mother   . Breast cancer Neg Hx       Review of Systems  Constitutional: negative for fatigue and weight loss Respiratory: negative for cough and wheezing Cardiovascular: negative for chest pain, fatigue and palpitations Gastrointestinal: negative for abdominal pain and change in bowel habits Musculoskeletal:negative for myalgias Neurological: negative for gait problems and tremors Behavioral/Psych: negative for abusive relationship, depression Endocrine: negative for temperature intolerance    Genitourinary:negative for abnormal menstrual periods, genital lesions, hot flashes, sexual problems.  Positive for vaginal discharge Integument/breast: negative for breast lump, breast tenderness, nipple discharge and skin lesion(s)    Objective:       BP 119/74   Pulse 69   Wt 183 lb 6.4 oz (83.2 kg)   LMP 09/22/2020 (Approximate)   BMI 30.52 kg/m  General:   alert and no  distress  Skin:   no rash or abnormalities  Lungs:   clear to auscultation bilaterally  Heart:   regular rate and rhythm, S1, S2 normal, no murmur, click, rub or gallop  Breasts:   normal without suspicious masses, skin or nipple changes or axillary nodes  Abdomen:  normal findings: no organomegaly, soft, non-tender and no hernia  Pelvis:  External genitalia: normal general appearance Urinary system: urethral meatus normal and bladder without fullness, nontender Vaginal: normal without tenderness, induration or masses Cervix: normal  appearance Adnexa: normal bimanual exam Uterus: anteverted and non-tender, normal size   Lab Review Urine pregnancy test Labs reviewed yes Radiologic studies reviewed yes  50% of 20 min visit spent on counseling and coordination of care.   Assessment:     1. Encounter for routine gynecological examination with Papanicolaou smear of cervix Rx: - Cytology - PAP( [facility])  2. Vaginal discharge Rx: - Cervicovaginal ancillary only  3. Screening breast examination Rx: - MM Digital Screening; Future  4. Encounter for surveillance of implantable subdermal contraceptive - Nexplanon due for removal this year    Plan:    Education reviewed: calcium supplements, depression evaluation, low fat, low cholesterol diet, safe sex/STD prevention, self breast exams and weight bearing exercise. Contraception: Nexplanon. Mammogram ordered. Follow up in: 2 weeks.  Nexplanon Removal / Reinsertion   Orders Placed This Encounter  Procedures  . MM Digital Screening    Standing Status:   Future    Standing Expiration Date:   09/25/2021    Order Specific Question:   Reason for Exam (SYMPTOM  OR DIAGNOSIS REQUIRED)    Answer:   Screening    Order Specific Question:   Is the patient pregnant?    Answer:   No    Order Specific Question:   Preferred imaging location?    Answer:   [facility]    Brock Bad, MD 09/25/2020 9:58 AM    Nexplanon inserted 09/11/2017.   Last pap 08/27/2017, normal.

## 2020-09-26 LAB — CERVICOVAGINAL ANCILLARY ONLY
Chlamydia: NEGATIVE
Comment: NEGATIVE
Comment: NEGATIVE
Comment: NORMAL
Neisseria Gonorrhea: NEGATIVE
Trichomonas: NEGATIVE

## 2020-09-27 LAB — CYTOLOGY - PAP
Comment: NEGATIVE
Diagnosis: NEGATIVE
High risk HPV: NEGATIVE

## 2020-10-09 ENCOUNTER — Ambulatory Visit (INDEPENDENT_AMBULATORY_CARE_PROVIDER_SITE_OTHER): Payer: Self-pay | Admitting: Obstetrics

## 2020-10-09 ENCOUNTER — Other Ambulatory Visit: Payer: Self-pay

## 2020-10-09 DIAGNOSIS — Z3046 Encounter for surveillance of implantable subdermal contraceptive: Secondary | ICD-10-CM

## 2020-10-09 NOTE — Progress Notes (Signed)
Patient late for visit.  Rescheduled.  Brock Bad, MD 10/09/2020 12:15 PM

## 2020-10-23 ENCOUNTER — Other Ambulatory Visit: Payer: Self-pay

## 2020-10-23 ENCOUNTER — Ambulatory Visit
Admission: RE | Admit: 2020-10-23 | Discharge: 2020-10-23 | Disposition: A | Discharge status: Home / Self Care | Payer: Managed Care, Other (non HMO) | Source: Ambulatory Visit | Attending: Obstetrics | Admitting: Obstetrics

## 2020-10-23 DIAGNOSIS — Z1239 Encounter for other screening for malignant neoplasm of breast: Secondary | ICD-10-CM

## 2020-10-30 ENCOUNTER — Encounter: Payer: Self-pay | Admitting: Obstetrics

## 2020-10-30 ENCOUNTER — Ambulatory Visit (INDEPENDENT_AMBULATORY_CARE_PROVIDER_SITE_OTHER): Payer: Managed Care, Other (non HMO) | Admitting: Obstetrics

## 2020-10-30 ENCOUNTER — Other Ambulatory Visit: Payer: Self-pay

## 2020-10-30 VITALS — BP 126/76 | HR 70 | Wt 186.0 lb

## 2020-10-30 DIAGNOSIS — Z3046 Encounter for surveillance of implantable subdermal contraceptive: Secondary | ICD-10-CM

## 2020-10-30 MED ORDER — ETONOGESTREL 68 MG ~~LOC~~ IMPL
68.0000 mg | DRUG_IMPLANT | Freq: Once | SUBCUTANEOUS | Status: AC
Start: 2020-10-30 — End: 2020-10-30
  Administered 2020-10-30: 68 mg via SUBCUTANEOUS

## 2020-10-30 NOTE — Addendum Note (Signed)
Addended by: Marya Landry D on: 10/30/2020 10:58 AM   Modules accepted: Orders

## 2020-10-30 NOTE — Progress Notes (Signed)
Nexplanon Procedure Note    PROCEDURE: Nexplanon Removal and Reinsertion Performing Provider: Brock Bad MD  Patient education prior to procedure, explained risk, benefits of Nexplanon, reviewed alternative options. Patient reported understanding. Gave consent to continue with procedure.  Patient had NEXPLANON inserted in 2018. Desires removal today w/ reinsertion  PROCEDURE:  Pregnancy Text :  not indicated Site (check):      left arm         Sterile Preparation:   Betadinex3 Lot # [id number] Expiration Date:  2024 JAN 14    The patient's left arm was palpated and the implant device located. The area was prepped with Betadinex3. The distal end of the device was palpated and 1.5 cc of 1% lidocaine without epinephrine was injected. A 1.5 mm incision was made. Any fibrotic tissue was carefully dissected away using blunt and/or sharp dissection. The device was removed in an intact manner.   Insertion site was the same as the removal site. Nexplanon  was inserted subcutaneously.Needle was removed from the insertion site. Nexplanon capsule was palpated by provider and patient to assure satisfactory placement. Dressing applied.  Followup: The patient tolerated the procedure well without complications.  Standard post-procedure care is explained and return precautions are given.  Brock Bad MD 10/30/20

## 2020-10-30 NOTE — Patient Instructions (Signed)
Menopause Menopause is the normal time of life when menstrual periods stop completely. It is usually confirmed by 12 months without a menstrual period. The transition to menopause (perimenopause) most often happens between the ages of 45 and 55. During perimenopause, hormone levels change in your body, which can cause symptoms and affect your health. Menopause may increase your risk for:  Loss of bone (osteoporosis), which causes bone breaks (fractures).  Depression.  Hardening and narrowing of the arteries (atherosclerosis), which can cause heart attacks and strokes. What are the causes? This condition is usually caused by a natural change in hormone levels that happens as you get older. The condition may also be caused by surgery to remove both ovaries (bilateral oophorectomy). What increases the risk? This condition is more likely to start at an earlier age if you have certain medical conditions or treatments, including:  A tumor of the pituitary gland in the brain.  A disease that affects the ovaries and hormone production.  Radiation treatment for cancer.  Certain cancer treatments, such as chemotherapy or hormone (anti-estrogen) therapy.  Heavy smoking and excessive alcohol use.  Family history of early menopause. This condition is also more likely to develop earlier in women who are very thin. What are the signs or symptoms? Symptoms of this condition include:  Hot flashes.  Irregular menstrual periods.  Night sweats.  Changes in feelings about sex. This could be a decrease in sex drive or an increased comfort around your sexuality.  Vaginal dryness and thinning of the vaginal walls. This may cause painful intercourse.  Dryness of the skin and development of wrinkles.  Headaches.  Problems sleeping (insomnia).  Mood swings or irritability.  Memory problems.  Weight gain.  Hair growth on the face and chest.  Bladder infections or problems with urinating. How  is this diagnosed? This condition is diagnosed based on your medical history, a physical exam, your age, your menstrual history, and your symptoms. Hormone tests may also be done. How is this treated? In some cases, no treatment is needed. You and your health care provider should make a decision together about whether treatment is necessary. Treatment will be based on your individual condition and preferences. Treatment for this condition focuses on managing symptoms. Treatment may include:  Menopausal hormone therapy (MHT).  Medicines to treat specific symptoms or complications.  Acupuncture.  Vitamin or herbal supplements. Before starting treatment, make sure to let your health care provider know if you have a personal or family history of:  Heart disease.  Breast cancer.  Blood clots.  Diabetes.  Osteoporosis. Follow these instructions at home: Lifestyle  Do not use any products that contain nicotine or tobacco, such as cigarettes and e-cigarettes. If you need help quitting, ask your health care provider.  Get at least 30 minutes of physical activity on 5 or more days each week.  Avoid alcoholic and caffeinated beverages, as well as spicy foods. This may help prevent hot flashes.  Get 7-8 hours of sleep each night.  If you have hot flashes, try: ? Dressing in layers. ? Avoiding things that may trigger hot flashes, such as spicy food, warm places, or stress. ? Taking slow, deep breaths when a hot flash starts. ? Keeping a fan in your home and office.  Find ways to manage stress, such as deep breathing, meditation, or journaling.  Consider going to group therapy with other women who are having menopause symptoms. Ask your health care provider about recommended group therapy meetings. Eating and   drinking  Eat a healthy, balanced diet that contains whole grains, lean protein, low-fat dairy, and plenty of fruits and vegetables.  Your health care provider may recommend  adding more soy to your diet. Foods that contain soy include tofu, tempeh, and soy milk.  Eat plenty of foods that contain calcium and vitamin D for bone health. Items that are rich in calcium include low-fat milk, yogurt, beans, almonds, sardines, broccoli, and kale. Medicines  Take over-the-counter and prescription medicines only as told by your health care provider.  Talk with your health care provider before starting any herbal supplements. If prescribed, take vitamins and supplements as told by your health care provider. These may include: ? Calcium. Women age 51 and older should get 1,200 mg (milligrams) of calcium every day. ? Vitamin D. Women need 600-800 International Units of vitamin D each day. ? Vitamins B12 and B6. Aim for 50 micrograms of B12 and 1.5 mg of B6 each day. General instructions  Keep track of your menstrual periods, including: ? When they occur. ? How heavy they are and how long they last. ? How much time passes between periods.  Keep track of your symptoms, noting when they start, how often you have them, and how long they last.  Use vaginal lubricants or moisturizers to help with vaginal dryness and improve comfort during sex.  Keep all follow-up visits as told by your health care provider. This is important. This includes any group therapy or counseling. Contact a health care provider if:  You are still having menstrual periods after age 55.  You have pain during sex.  You have not had a period for 12 months and you develop vaginal bleeding. Get help right away if:  You have: ? Severe depression. ? Excessive vaginal bleeding. ? Pain when you urinate. ? A fast or irregular heart beat (palpitations). ? Severe headaches. ? Abdomen (abdominal) pain or severe indigestion.  You fell and you think you have a broken bone.  You develop leg or chest pain.  You develop vision problems.  You feel a lump in your breast. Summary  Menopause is the normal  time of life when menstrual periods stop completely. It is usually confirmed by 12 months without a menstrual period.  The transition to menopause (perimenopause) most often happens between the ages of 45 and 55.  Symptoms can be managed through medicines, lifestyle changes, and complementary therapies such as acupuncture.  Eat a balanced diet that is rich in nutrients to promote bone health and heart health and to manage symptoms during menopause. This information is not intended to replace advice given to you by your health care provider. Make sure you discuss any questions you have with your health care provider. Document Revised: 11/20/2017 Document Reviewed: 01/10/2017 [employer] Patient Education  2020 [employer]  

## 2020-11-09 IMAGING — MG DIGITAL SCREENING BILAT W/ CAD
4 series · 4 of 4 positions shown · non-contrast
Comparison: Previous exam(s).

CLINICAL DATA: Screening.

EXAM:
DIGITAL SCREENING BILATERAL MAMMOGRAM WITH CAD

[R MLO]
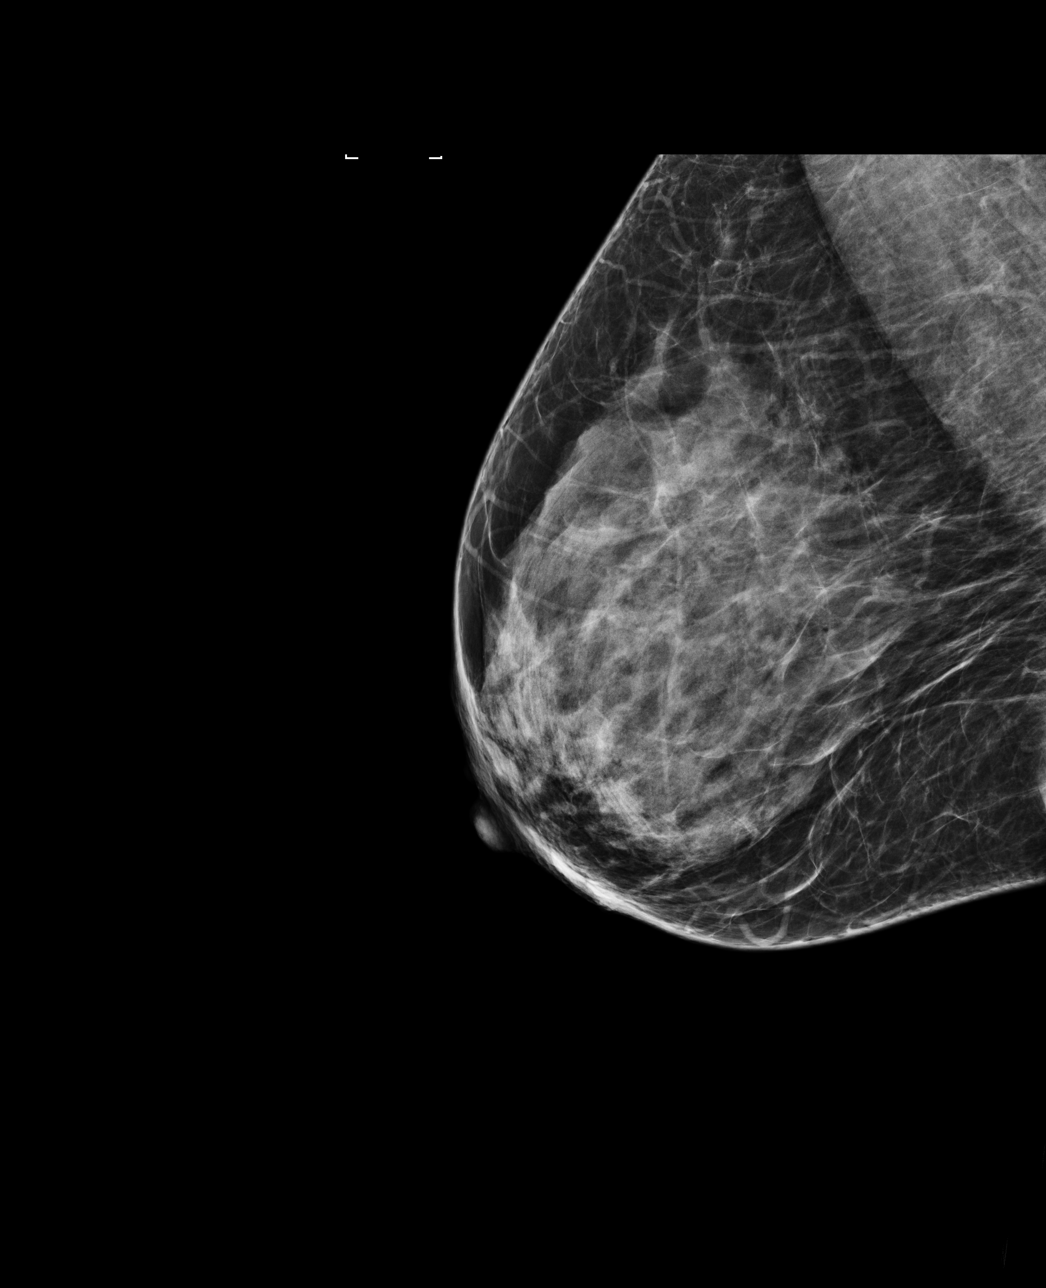

[L CC]
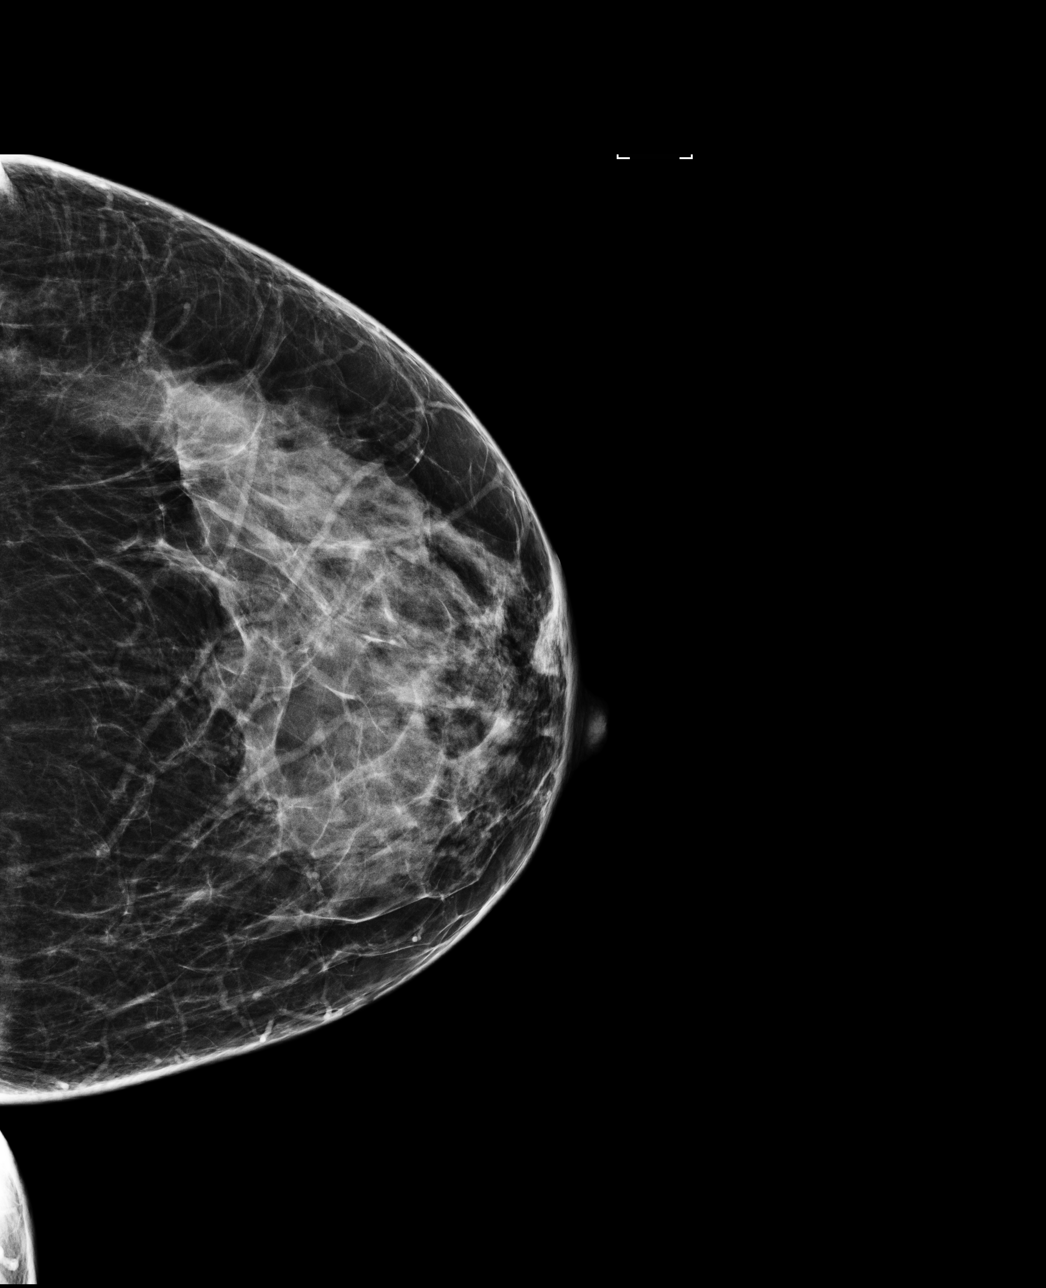

[L MLO]
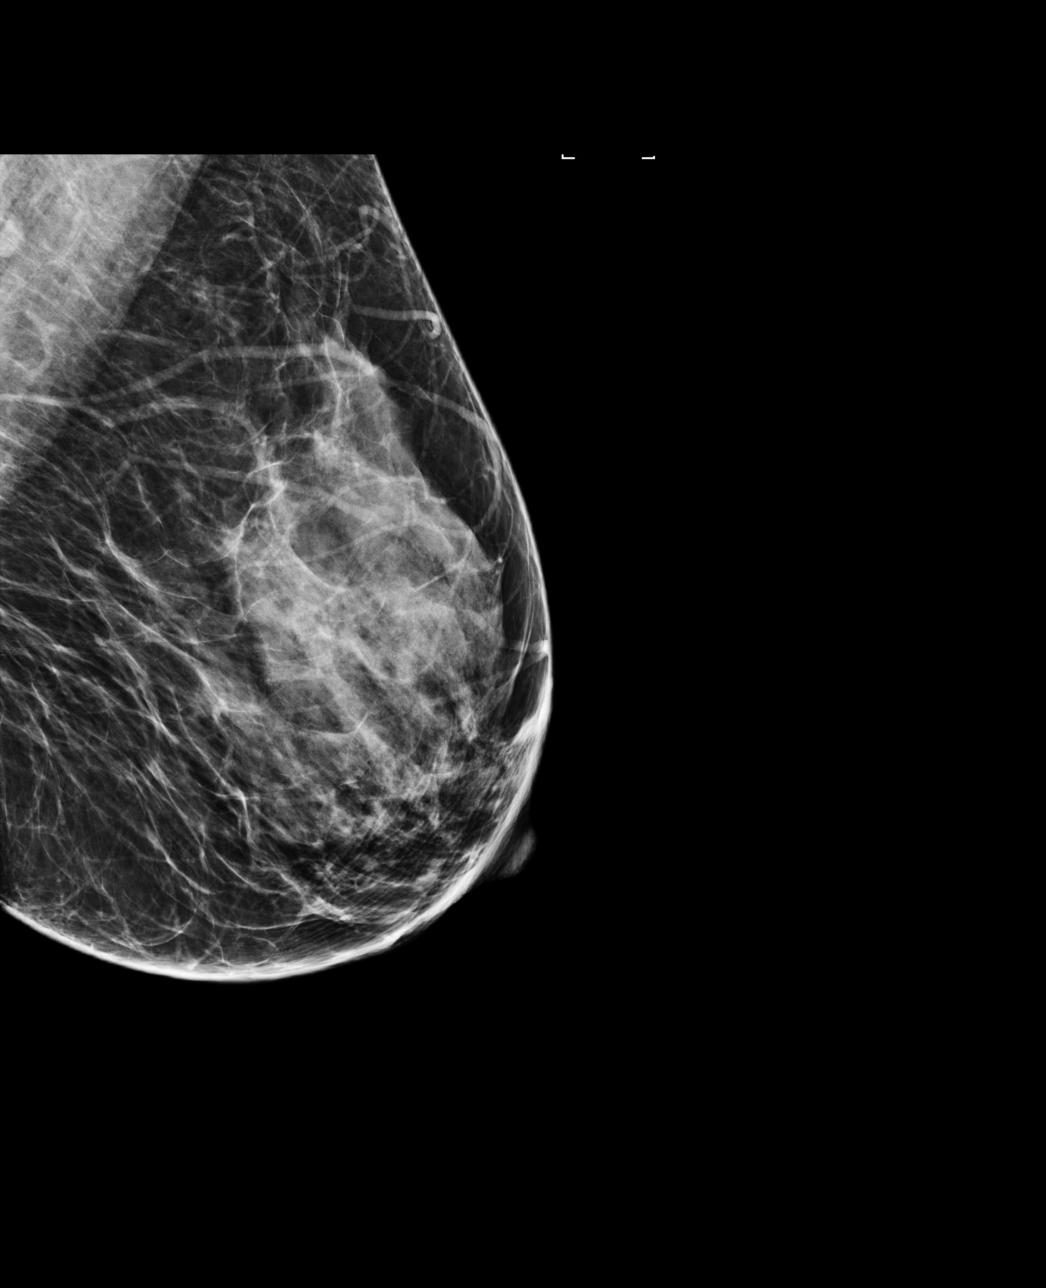

[R CC]
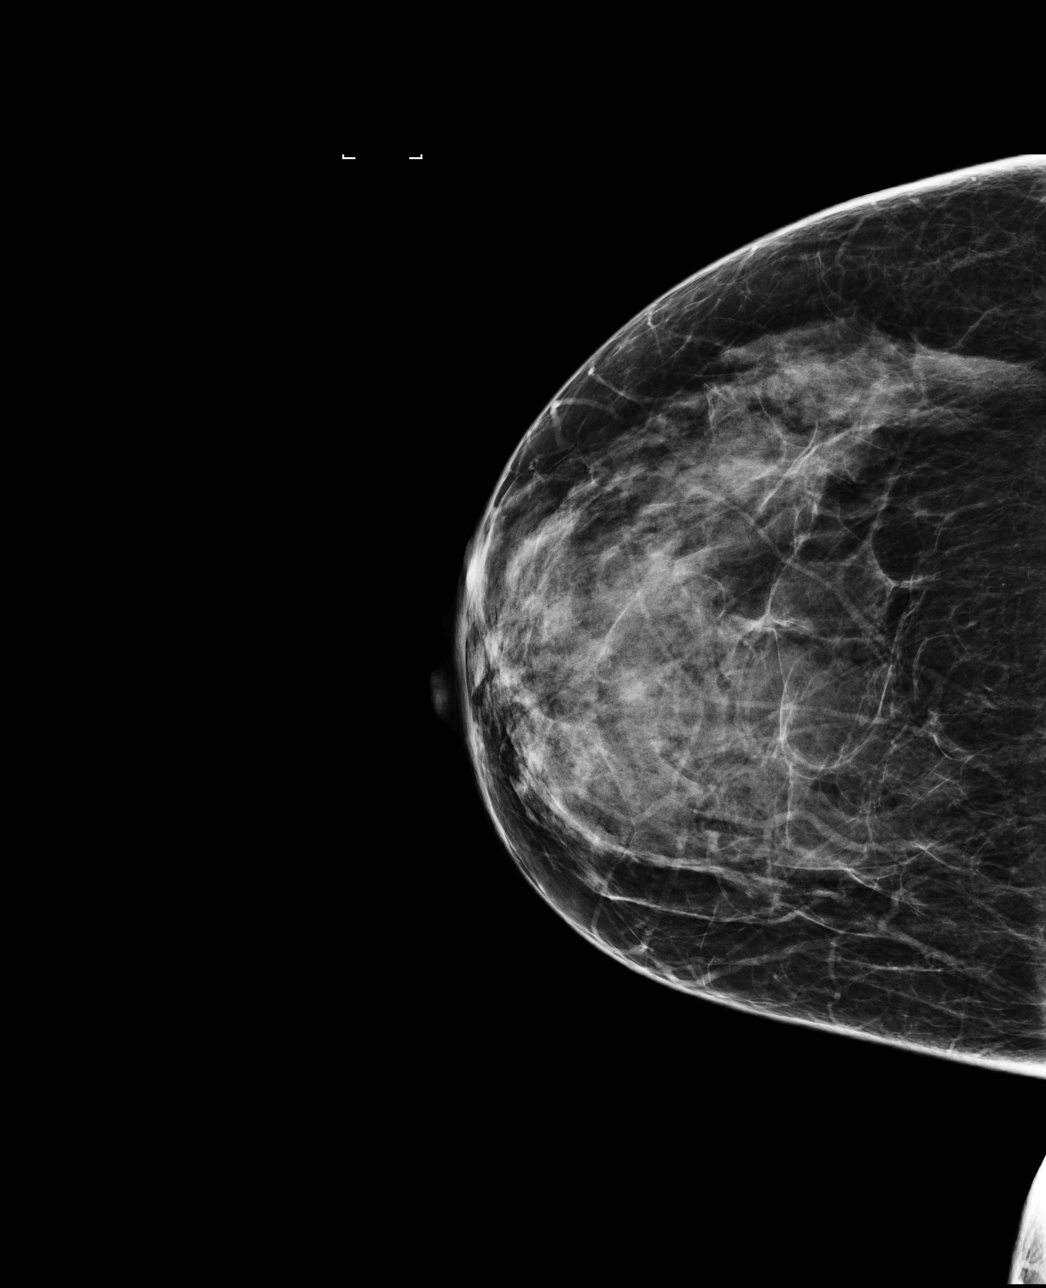

[4 of 4 positions shown; findings below may reference images not displayed]

ACR Breast Density Category c: The breast tissue is heterogeneously
dense, which may obscure small masses.
FINDINGS: There are no findings suspicious for malignancy. Images were
processed with CAD.
IMPRESSION: No mammographic evidence of malignancy. A result letter of this
screening mammogram will be mailed directly to the patient.

RECOMMENDATION:
Screening mammogram in one year. (Code:[id number])

BI-RADS CATEGORY  1: Negative.

## 2020-11-13 ENCOUNTER — Ambulatory Visit (INDEPENDENT_AMBULATORY_CARE_PROVIDER_SITE_OTHER): Payer: Managed Care, Other (non HMO) | Admitting: Obstetrics

## 2020-11-13 ENCOUNTER — Encounter: Payer: Self-pay | Admitting: Obstetrics

## 2020-11-13 ENCOUNTER — Other Ambulatory Visit: Payer: Self-pay

## 2020-11-13 VITALS — BP 137/81 | HR 71 | Ht 65.0 in | Wt 189.0 lb

## 2020-11-13 DIAGNOSIS — Z3202 Encounter for pregnancy test, result negative: Secondary | ICD-10-CM

## 2020-11-13 DIAGNOSIS — Z3046 Encounter for surveillance of implantable subdermal contraceptive: Secondary | ICD-10-CM | POA: Diagnosis not present

## 2020-11-13 NOTE — Progress Notes (Signed)
No complaints with Nexplanon

## 2020-11-13 NOTE — Progress Notes (Signed)
Subjective:    [name] is a 49 y.o. female who presents for Nexplanon Surveillance. The patient has no complaints today. The patient is sexually active. Pertinent past medical history: current smoker.  The information documented in the HPI was reviewed and verified.  Menstrual History: OB History    Gravida  2   Para  2   Term  2   Preterm      AB      Living  2     SAB      TAB      Ectopic      Multiple      Live Births  2           No LMP recorded. Patient has had an implant.   Patient Active Problem List   Diagnosis Date Noted  . Symptomatic cholelithiasis 05/26/2012   Past Medical History:  Diagnosis Date  . Anemia   . Heart murmur     Past Surgical History:  Procedure Laterality Date  . CESAREAN SECTION  [id number]  . CHOLECYSTECTOMY     07/19/12     Current Outpatient Medications:  .  etonogestrel (NEXPLANON) 68 MG IMPL implant, 1 each by Subdermal route once., Disp: , Rfl:  .  Multiple Vitamins-Minerals (MULTIVITAMIN WITH MINERALS) tablet, Take 1 tablet by mouth daily. (Patient not taking: Reported on 11/13/2020), Disp: , Rfl:  No Known Allergies  Social History   Tobacco Use  . Smoking status: Current Some Day Smoker    Last attempt to quit: 05/26/2005    Years since quitting: 15.4  . Smokeless tobacco: Never Used  Substance Use Topics  . Alcohol use: Yes    Comment: occ    Family History  Problem Relation Age of Onset  . Aneurysm Mother   . Breast cancer Neg Hx        Review of Systems Constitutional: negative for weight loss Genitourinary:negative for abnormal menstrual periods and vaginal discharge   Objective:   BP 137/81   Pulse 71   Ht 5\' 5"  (1.651 m)   Wt 189 lb (85.7 kg)   BMI 31.45 kg/m   PE:           General: Alert and no distress          Left Upper Extremity: Nexplanon insertion site is clean, dry, intact and non tender.  Rod                                             palpated, and is intact, non  tender.   Lab Review Urine pregnancy test Labs reviewed yes Radiologic studies reviewed no  50% of 15 min visit spent on counseling and coordination of care.    Assessment:    49 y.o., continuing Nexplanon, no contraindications.   Plan:    All questions answered. Contraception: Nexplanon. Discussed healthy lifestyle modifications. Follow up in 1 year.  54, MD 11/13/2020 8:14 AM
